# Patient Record
Sex: Female | Born: 1990 | Race: Black or African American | Hispanic: No | Marital: Single | Smoking: Never smoker
Health system: Southern US, Community
[De-identification: ages and names within clinical notes are randomized; demographics above are authoritative.]

## PROBLEM LIST (undated history)

## (undated) DIAGNOSIS — O009 Unspecified ectopic pregnancy without intrauterine pregnancy: Secondary | ICD-10-CM

## (undated) DIAGNOSIS — R87629 Unspecified abnormal cytological findings in specimens from vagina: Secondary | ICD-10-CM

## (undated) HISTORY — PX: ECTOPIC PREGNANCY SURGERY: SHX613

## (undated) HISTORY — DX: Unspecified ectopic pregnancy without intrauterine pregnancy: O00.90

## (undated) HISTORY — DX: Morbid (severe) obesity due to excess calories: E66.01

## (undated) HISTORY — DX: Unspecified abnormal cytological findings in specimens from vagina: R87.629

---
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
## 2008-05-13 ENCOUNTER — Emergency Department: Payer: Self-pay | Admitting: Unknown Physician Specialty

## 2008-07-27 ENCOUNTER — Emergency Department: Payer: Self-pay | Admitting: Emergency Medicine

## 2009-04-17 ENCOUNTER — Emergency Department: Payer: Self-pay | Admitting: Emergency Medicine

## 2009-11-18 ENCOUNTER — Emergency Department: Payer: Self-pay | Admitting: Emergency Medicine

## 2010-11-03 ENCOUNTER — Emergency Department: Payer: Self-pay | Admitting: Internal Medicine

## 2011-06-27 ENCOUNTER — Emergency Department: Payer: Self-pay | Admitting: Psychiatry

## 2012-02-18 ENCOUNTER — Emergency Department: Payer: Self-pay | Admitting: Emergency Medicine

## 2012-02-18 LAB — PREGNANCY, URINE: Pregnancy Test, Urine: NEGATIVE m[IU]/mL

## 2012-07-12 ENCOUNTER — Observation Stay: Payer: Self-pay | Admitting: Specialist

## 2012-07-12 HISTORY — PX: ANKLE SURGERY: SHX546

## 2012-07-12 LAB — BASIC METABOLIC PANEL
Calcium, Total: 8.8 mg/dL (ref 8.5–10.1)
Chloride: 108 mmol/L — ABNORMAL HIGH (ref 98–107)
Co2: 26 mmol/L (ref 21–32)
EGFR (African American): >60
Glucose: 106 mg/dL — ABNORMAL HIGH (ref 65–99)
Osmolality: 280 (ref 275–301)
Potassium: 4 mmol/L (ref 3.5–5.1)
Sodium: 140 mmol/L (ref 136–145)

## 2012-07-12 LAB — CBC
HGB: 13.7 g/dL (ref 12.0–16.0)
MCHC: 33.8 g/dL (ref 32.0–36.0)
RDW: 13.5 % (ref 11.5–14.5)
WBC: 8.6 10*3/uL (ref 3.6–11.0)

## 2012-08-27 ENCOUNTER — Emergency Department: Payer: Self-pay | Admitting: Emergency Medicine

## 2012-10-26 ENCOUNTER — Emergency Department: Payer: Self-pay | Admitting: Emergency Medicine

## 2012-12-30 ENCOUNTER — Emergency Department: Payer: Self-pay | Admitting: Emergency Medicine

## 2012-12-30 LAB — CBC
HCT: 40 % (ref 35.0–47.0)
HGB: 13.2 g/dL (ref 12.0–16.0)
MCH: 29 pg (ref 26.0–34.0)
MCV: 88 fL (ref 80–100)
Platelet: 230 10*3/uL (ref 150–440)
WBC: 8.6 10*3/uL (ref 3.6–11.0)

## 2012-12-30 LAB — COMPREHENSIVE METABOLIC PANEL
Albumin: 3.4 g/dL (ref 3.4–5.0)
Bilirubin,Total: 0.1 mg/dL — ABNORMAL LOW (ref 0.2–1.0)
Co2: 26 mmol/L (ref 21–32)
EGFR (African American): >60
EGFR (Non-African Amer.): >60
Osmolality: 272 (ref 275–301)
SGOT(AST): 22 U/L (ref 15–37)
Total Protein: 7.4 g/dL (ref 6.4–8.2)

## 2012-12-30 LAB — URINALYSIS, COMPLETE
Bilirubin,UR: NEGATIVE
Blood: NEGATIVE
Ketone: NEGATIVE
Nitrite: NEGATIVE
Protein: NEGATIVE
RBC,UR: 4 /HPF (ref 0–5)
Specific Gravity: 1.025 (ref 1.003–1.030)
Squamous Epithelial: 3

## 2013-01-05 ENCOUNTER — Emergency Department: Payer: Self-pay | Admitting: Emergency Medicine

## 2013-02-22 ENCOUNTER — Emergency Department: Payer: Self-pay | Admitting: Emergency Medicine

## 2013-02-22 LAB — COMPREHENSIVE METABOLIC PANEL
Albumin: 2.9 g/dL — ABNORMAL LOW (ref 3.4–5.0)
Alkaline Phosphatase: 64 U/L (ref 50–136)
BUN: 8 mg/dL (ref 7–18)
Bilirubin,Total: 0.2 mg/dL (ref 0.2–1.0)
Calcium, Total: 9 mg/dL (ref 8.5–10.1)
Chloride: 106 mmol/L (ref 98–107)
Co2: 24 mmol/L (ref 21–32)
EGFR (African American): >60
Osmolality: 274 (ref 275–301)
Potassium: 3.7 mmol/L (ref 3.5–5.1)
SGOT(AST): 130 U/L — ABNORMAL HIGH (ref 15–37)
SGPT (ALT): 302 U/L — ABNORMAL HIGH (ref 12–78)
Sodium: 138 mmol/L (ref 136–145)
Total Protein: 6.8 g/dL (ref 6.4–8.2)

## 2013-02-22 LAB — CBC
HGB: 12.2 g/dL (ref 12.0–16.0)
MCHC: 34.2 g/dL (ref 32.0–36.0)
MCV: 86 fL (ref 80–100)
RBC: 4.15 10*6/uL (ref 3.80–5.20)

## 2013-02-22 LAB — URINALYSIS, COMPLETE
Bilirubin,UR: NEGATIVE
Blood: NEGATIVE
Glucose,UR: NEGATIVE mg/dL (ref 0–75)
Ketone: NEGATIVE
RBC,UR: 1 /HPF (ref 0–5)
Specific Gravity: 1.021 (ref 1.003–1.030)
Squamous Epithelial: 17

## 2013-03-14 ENCOUNTER — Observation Stay: Payer: Self-pay | Admitting: Obstetrics and Gynecology

## 2013-03-14 LAB — URINALYSIS, COMPLETE
Blood: NEGATIVE
Glucose,UR: 50 mg/dL (ref 0–75)
Ketone: NEGATIVE
RBC,UR: 1 /HPF (ref 0–5)
Squamous Epithelial: 2
WBC UR: 2 /HPF (ref 0–5)

## 2013-05-04 ENCOUNTER — Observation Stay: Payer: Self-pay | Admitting: Obstetrics and Gynecology

## 2013-05-04 LAB — BASIC METABOLIC PANEL
Anion Gap: 7 (ref 7–16)
BUN: 8 mg/dL (ref 7–18)
Calcium, Total: 9 mg/dL (ref 8.5–10.1)
Creatinine: 0.68 mg/dL (ref 0.60–1.30)
EGFR (Non-African Amer.): >60
Glucose: 77 mg/dL (ref 65–99)
Osmolality: 275 (ref 275–301)
Potassium: 4 mmol/L (ref 3.5–5.1)
Sodium: 139 mmol/L (ref 136–145)

## 2013-05-04 LAB — URINALYSIS, COMPLETE
Bacteria: NONE SEEN
Bilirubin,UR: NEGATIVE
Glucose,UR: NEGATIVE mg/dL (ref 0–75)
Leukocyte Esterase: NEGATIVE
Ph: 6 (ref 4.5–8.0)
Protein: NEGATIVE
Specific Gravity: 1.016 (ref 1.003–1.030)
Squamous Epithelial: <1
WBC UR: 2 /HPF (ref 0–5)

## 2013-05-30 ENCOUNTER — Observation Stay: Payer: Self-pay | Admitting: Obstetrics and Gynecology

## 2013-05-30 LAB — CBC
MCH: 31 pg (ref 26.0–34.0)
MCHC: 35.1 g/dL (ref 32.0–36.0)
RBC: 3.9 10*6/uL (ref 3.80–5.20)

## 2013-05-30 LAB — BASIC METABOLIC PANEL
Anion Gap: 6 — ABNORMAL LOW (ref 7–16)
Chloride: 108 mmol/L — ABNORMAL HIGH (ref 98–107)
Co2: 24 mmol/L (ref 21–32)
Creatinine: 0.55 mg/dL — ABNORMAL LOW (ref 0.60–1.30)
EGFR (African American): >60
EGFR (Non-African Amer.): >60
Glucose: 108 mg/dL — ABNORMAL HIGH (ref 65–99)
Osmolality: 274 (ref 275–301)

## 2013-06-17 ENCOUNTER — Observation Stay: Payer: Self-pay | Admitting: Obstetrics and Gynecology

## 2013-06-17 LAB — URINALYSIS, COMPLETE
Bilirubin,UR: NEGATIVE
Blood: NEGATIVE
Glucose,UR: 50 mg/dL (ref 0–75)
Ketone: NEGATIVE
Nitrite: NEGATIVE
Ph: 7 (ref 4.5–8.0)
Protein: NEGATIVE
Specific Gravity: 1.011 (ref 1.003–1.030)
Squamous Epithelial: 4
WBC UR: 3 /HPF (ref 0–5)

## 2013-06-19 LAB — URINE CULTURE

## 2013-07-05 ENCOUNTER — Observation Stay: Payer: Self-pay | Admitting: Obstetrics and Gynecology

## 2013-07-17 ENCOUNTER — Observation Stay: Payer: Self-pay

## 2013-07-25 ENCOUNTER — Observation Stay: Payer: Self-pay | Admitting: Obstetrics & Gynecology

## 2013-07-27 ENCOUNTER — Inpatient Hospital Stay: Payer: Self-pay

## 2013-07-27 LAB — CBC WITH DIFFERENTIAL/PLATELET
Basophil #: 0 10*3/uL (ref 0.0–0.1)
Basophil %: 0.3 %
Eosinophil #: 0.1 10*3/uL (ref 0.0–0.7)
Eosinophil %: 0.9 %
HCT: 37.6 % (ref 35.0–47.0)
Lymphocyte #: 1.5 10*3/uL (ref 1.0–3.6)
Lymphocyte %: 17.3 %
MCHC: 34.4 g/dL (ref 32.0–36.0)
MCV: 87 fL (ref 80–100)
Monocyte #: 0.7 x10 3/mm (ref 0.2–0.9)
Monocyte %: 8.2 %
Neutrophil #: 6.3 10*3/uL (ref 1.4–6.5)
Platelet: 180 10*3/uL (ref 150–440)
RBC: 4.31 10*6/uL (ref 3.80–5.20)
RDW: 14 % (ref 11.5–14.5)

## 2013-07-28 LAB — HEMATOCRIT: HCT: 32.2 % — ABNORMAL LOW (ref 35.0–47.0)

## 2013-08-03 ENCOUNTER — Emergency Department: Payer: Self-pay | Admitting: Emergency Medicine

## 2013-08-03 LAB — COMPREHENSIVE METABOLIC PANEL
Albumin: 2.4 g/dL — ABNORMAL LOW (ref 3.4–5.0)
Alkaline Phosphatase: 181 U/L — ABNORMAL HIGH (ref 50–136)
Anion Gap: 7 (ref 7–16)
Bilirubin,Total: 0.2 mg/dL (ref 0.2–1.0)
Calcium, Total: 8.4 mg/dL — ABNORMAL LOW (ref 8.5–10.1)
Creatinine: 0.9 mg/dL (ref 0.60–1.30)
EGFR (African American): >60
EGFR (Non-African Amer.): >60
Osmolality: 282 (ref 275–301)
Potassium: 3.7 mmol/L (ref 3.5–5.1)
SGOT(AST): 38 U/L — ABNORMAL HIGH (ref 15–37)
Total Protein: 6.1 g/dL — ABNORMAL LOW (ref 6.4–8.2)

## 2013-08-03 LAB — CBC WITH DIFFERENTIAL/PLATELET
Basophil #: 0 10*3/uL (ref 0.0–0.1)
Basophil %: 0.3 %
Eosinophil #: 0.2 10*3/uL (ref 0.0–0.7)
Eosinophil %: 2 %
HGB: 11.3 g/dL — ABNORMAL LOW (ref 12.0–16.0)
Lymphocyte %: 22.2 %
MCH: 30.3 pg (ref 26.0–34.0)
MCHC: 34.9 g/dL (ref 32.0–36.0)
MCV: 87 fL (ref 80–100)
Monocyte %: 7.6 %
Neutrophil #: 5.2 10*3/uL (ref 1.4–6.5)
RBC: 3.72 10*6/uL — ABNORMAL LOW (ref 3.80–5.20)
RDW: 14 % (ref 11.5–14.5)
WBC: 7.6 10*3/uL (ref 3.6–11.0)

## 2013-08-03 LAB — URINALYSIS, COMPLETE
Bacteria: NONE SEEN
Bilirubin,UR: NEGATIVE
Ketone: NEGATIVE
Ph: 6 (ref 4.5–8.0)
Protein: NEGATIVE
Specific Gravity: 1.009 (ref 1.003–1.030)
Squamous Epithelial: 4

## 2013-08-03 LAB — FIBRINOGEN: Fibrinogen: 415 mg/dL (ref 210–470)

## 2013-09-02 ENCOUNTER — Encounter: Payer: Self-pay | Admitting: Podiatry

## 2013-09-02 ENCOUNTER — Ambulatory Visit (INDEPENDENT_AMBULATORY_CARE_PROVIDER_SITE_OTHER): Payer: Medicaid Other

## 2013-09-02 ENCOUNTER — Ambulatory Visit (INDEPENDENT_AMBULATORY_CARE_PROVIDER_SITE_OTHER): Payer: Medicaid Other | Admitting: Podiatry

## 2013-09-02 VITALS — BP 125/75 | HR 68 | Resp 16 | Ht 60.0 in | Wt 215.0 lb

## 2013-09-02 DIAGNOSIS — M898X9 Other specified disorders of bone, unspecified site: Secondary | ICD-10-CM

## 2013-09-02 DIAGNOSIS — S93409A Sprain of unspecified ligament of unspecified ankle, initial encounter: Secondary | ICD-10-CM

## 2013-09-02 DIAGNOSIS — M25571 Pain in right ankle and joints of right foot: Secondary | ICD-10-CM

## 2013-09-02 DIAGNOSIS — S93609A Unspecified sprain of unspecified foot, initial encounter: Secondary | ICD-10-CM

## 2013-09-02 DIAGNOSIS — M779 Enthesopathy, unspecified: Secondary | ICD-10-CM

## 2013-09-02 MED ORDER — TRIAMCINOLONE ACETONIDE 10 MG/ML IJ SUSP
5.0000 mg | Freq: Once | INTRAMUSCULAR | Status: AC
  Administered 2013-09-02: 5 mg via INTRA_ARTICULAR

## 2013-09-02 NOTE — Patient Instructions (Signed)
Appointment at [facility] or [facility]

## 2013-09-02 NOTE — Progress Notes (Signed)
Subjective:     Patient ID: Miranda Fields, female   DOB: Mar 03, 1991, 22 y.o.   MRN: [id number]  Ankle Pain    patient points to the right ankle stating I had surgery last September and has continued to bother me. Patient states it swells at the end of the day and she has no idea what was done and does not know the doctor that did  Review of Systems  All other systems reviewed and are negative.       Objective:   Physical Exam  Nursing note and vitals reviewed. Constitutional: She is oriented to person, place, and time.  Cardiovascular: Intact distal pulses.   Musculoskeletal: Normal range of motion.  Neurological: She is oriented to person, place, and time.  Skin: Skin is warm.   patient's right lateral ankle is tender in the sinus tarsi and I didn't note normal range of motion with some popping but no crepitus mild edema noted diffuse in its nature    Assessment:     Difficult to give description because of poor historian. Sinus tarsitis    Plan:     H&P and x-rays reviewed with patient. Today I injected the sinus tarsi capsule 3 mg Kenalog 5 mg Xylocaine Marcaine mixture and advised to go to [facility] or to [place] for evaluation
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
## 2013-09-05 LAB — HM PAP SMEAR: HM Pap smear: NEGATIVE

## 2013-11-10 ENCOUNTER — Emergency Department: Payer: Self-pay | Admitting: Emergency Medicine

## 2013-11-10 LAB — COMPREHENSIVE METABOLIC PANEL
ALBUMIN: 3.7 g/dL (ref 3.4–5.0)
ALK PHOS: 132 U/L — AB
Anion Gap: 5 — ABNORMAL LOW (ref 7–16)
BUN: 14 mg/dL (ref 7–18)
Bilirubin,Total: 0.2 mg/dL (ref 0.2–1.0)
CALCIUM: 9 mg/dL (ref 8.5–10.1)
CHLORIDE: 106 mmol/L (ref 98–107)
CREATININE: 0.86 mg/dL (ref 0.60–1.30)
Co2: 29 mmol/L (ref 21–32)
EGFR (African American): >60
EGFR (Non-African Amer.): >60
GLUCOSE: 88 mg/dL (ref 65–99)
OSMOLALITY: 279 (ref 275–301)
POTASSIUM: 3.6 mmol/L (ref 3.5–5.1)
SGOT(AST): 13 U/L — ABNORMAL LOW (ref 15–37)
SGPT (ALT): 26 U/L (ref 12–78)
Sodium: 140 mmol/L (ref 136–145)
TOTAL PROTEIN: 7.4 g/dL (ref 6.4–8.2)

## 2013-11-10 LAB — URINALYSIS, COMPLETE
BILIRUBIN, UR: NEGATIVE
Bacteria: NONE SEEN
Blood: NEGATIVE
Glucose,UR: NEGATIVE mg/dL (ref 0–75)
Ketone: NEGATIVE
NITRITE: NEGATIVE
PH: 6 (ref 4.5–8.0)
Protein: NEGATIVE
RBC,UR: 1 /HPF (ref 0–5)
Specific Gravity: 1.018 (ref 1.003–1.030)
Squamous Epithelial: 2

## 2013-11-10 LAB — CBC WITH DIFFERENTIAL/PLATELET
Basophil #: 0 10*3/uL (ref 0.0–0.1)
Basophil %: 0.4 %
Eosinophil #: 0.1 10*3/uL (ref 0.0–0.7)
Eosinophil %: 1 %
HCT: 40.1 % (ref 35.0–47.0)
HGB: 13.3 g/dL (ref 12.0–16.0)
Lymphocyte #: 1.8 10*3/uL (ref 1.0–3.6)
Lymphocyte %: 25 %
MCH: 28.2 pg (ref 26.0–34.0)
MCHC: 33.1 g/dL (ref 32.0–36.0)
MCV: 85 fL (ref 80–100)
MONOS PCT: 5.8 %
Monocyte #: 0.4 x10 3/mm (ref 0.2–0.9)
Neutrophil #: 5 10*3/uL (ref 1.4–6.5)
Neutrophil %: 67.8 %
Platelet: 272 10*3/uL (ref 150–440)
RBC: 4.69 10*6/uL (ref 3.80–5.20)
RDW: 14.5 % (ref 11.5–14.5)
WBC: 7.3 10*3/uL (ref 3.6–11.0)

## 2013-11-10 LAB — TROPONIN I

## 2013-11-10 LAB — LIPASE, BLOOD: Lipase: 124 U/L (ref 73–393)

## 2014-05-11 ENCOUNTER — Emergency Department: Payer: Self-pay | Admitting: Emergency Medicine

## 2014-05-11 LAB — COMPREHENSIVE METABOLIC PANEL
ALT: 29 U/L (ref 12–78)
Albumin: 3.5 g/dL (ref 3.4–5.0)
Alkaline Phosphatase: 114 U/L
Anion Gap: 8 (ref 7–16)
BUN: 11 mg/dL (ref 7–18)
Bilirubin,Total: 0.6 mg/dL (ref 0.2–1.0)
CREATININE: 0.86 mg/dL (ref 0.60–1.30)
Calcium, Total: 8.4 mg/dL — ABNORMAL LOW (ref 8.5–10.1)
Chloride: 105 mmol/L (ref 98–107)
Co2: 25 mmol/L (ref 21–32)
EGFR (Non-African Amer.): >60
Glucose: 90 mg/dL (ref 65–99)
Osmolality: 275 (ref 275–301)
Potassium: 3.5 mmol/L (ref 3.5–5.1)
SGOT(AST): 28 U/L (ref 15–37)
Sodium: 138 mmol/L (ref 136–145)
Total Protein: 7.5 g/dL (ref 6.4–8.2)

## 2014-05-11 LAB — CBC
HCT: 41.9 % (ref 35.0–47.0)
HGB: 14.1 g/dL (ref 12.0–16.0)
MCH: 29.6 pg (ref 26.0–34.0)
MCHC: 33.8 g/dL (ref 32.0–36.0)
MCV: 88 fL (ref 80–100)
PLATELETS: 203 10*3/uL (ref 150–440)
RBC: 4.77 10*6/uL (ref 3.80–5.20)
RDW: 13.5 % (ref 11.5–14.5)
WBC: 6.3 10*3/uL (ref 3.6–11.0)

## 2014-05-11 LAB — URINALYSIS, COMPLETE
Bacteria: NONE SEEN
Bilirubin,UR: NEGATIVE
GLUCOSE, UR: NEGATIVE mg/dL (ref 0–75)
Ketone: NEGATIVE
Nitrite: NEGATIVE
PH: 6 (ref 4.5–8.0)
PROTEIN: NEGATIVE
RBC,UR: 2 /HPF (ref 0–5)
Specific Gravity: 1.018 (ref 1.003–1.030)
Squamous Epithelial: 4
WBC UR: 2 /HPF (ref 0–5)

## 2014-05-11 LAB — LIPASE, BLOOD: LIPASE: 102 U/L (ref 73–393)

## 2014-09-25 IMAGING — US ABDOMEN ULTRASOUND LIMITED
1 series · 14 of 25 positions shown · non-contrast
Comparison: none

REASON FOR EXAM: ABD PAIN AND ABN LFTS
COMMENTS:   Body Site: GB and Fossa, CBD, Head of Pancreas; Right Upper Quad

PROCEDURE:     US  - US ABDOMEN LIMITED SURVEY  - February 22, 2013  [DATE]
RESULT:     Comparison: None
TECHNIQUE: Multiple gray-scale and color-flow Doppler images of the right
upper quadrant are presented for review.

[Series 1: abdomen ultrasound limited · 0.25mm/px · 14 of 47 slices shown]
[im 1/47]
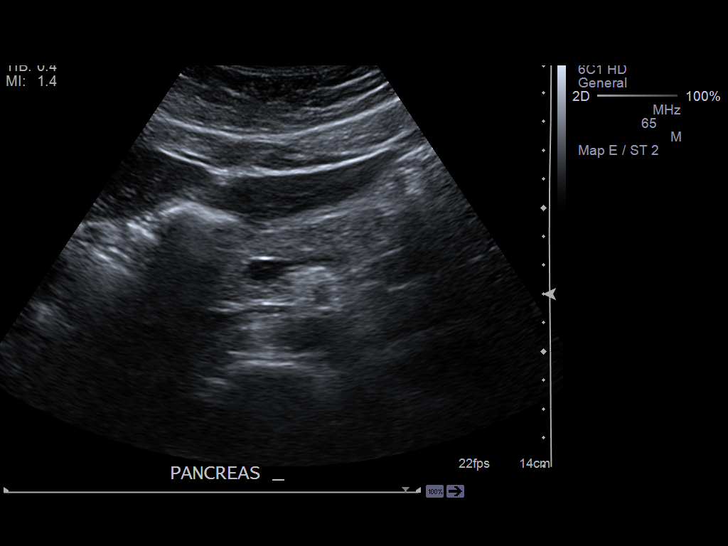
[im 4/47]
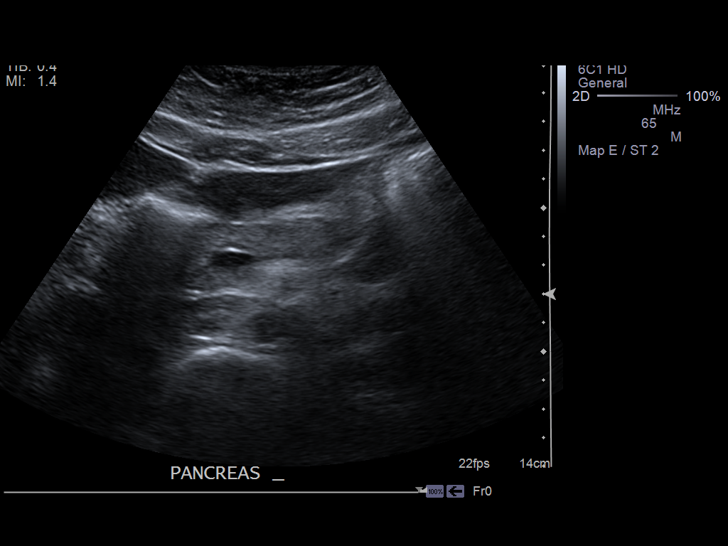
[im 8/47]
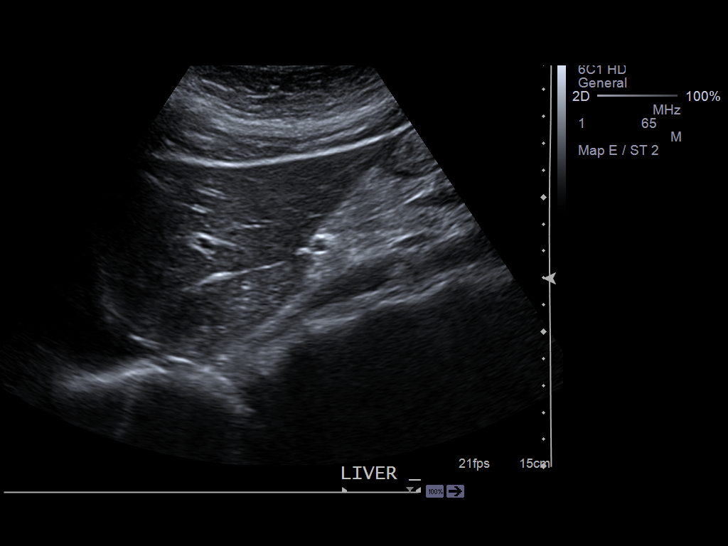
[im 12/47]
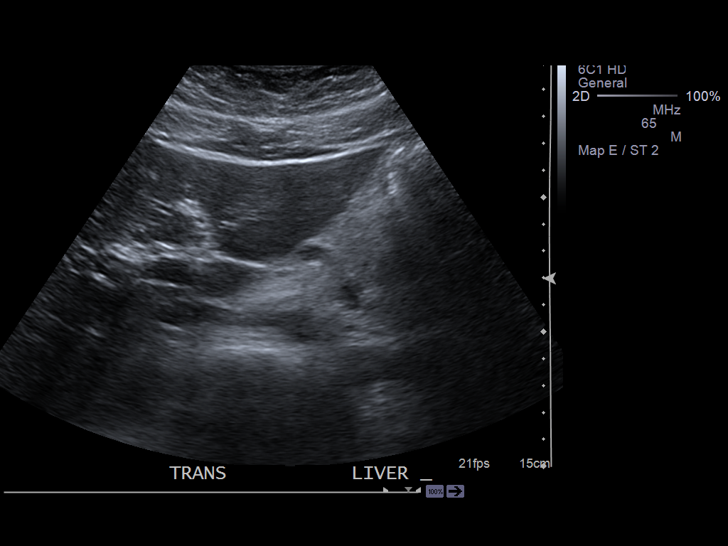
[im 16/47]
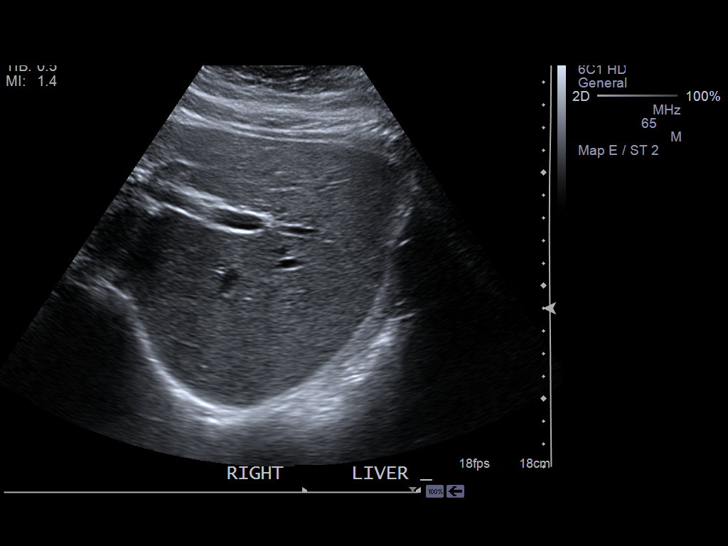
[im 18/47]
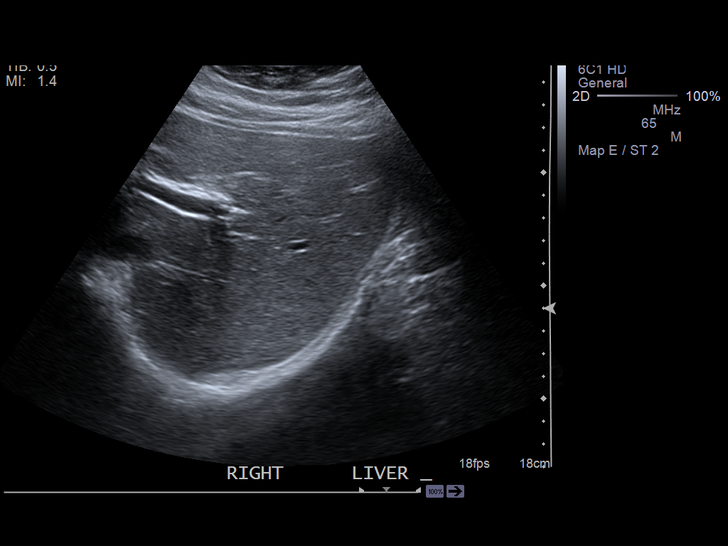
[im 22/47]
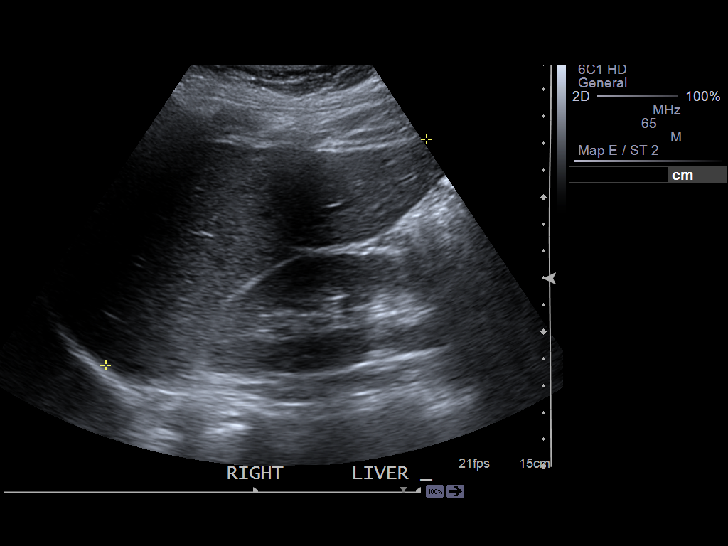
[im 25/47]
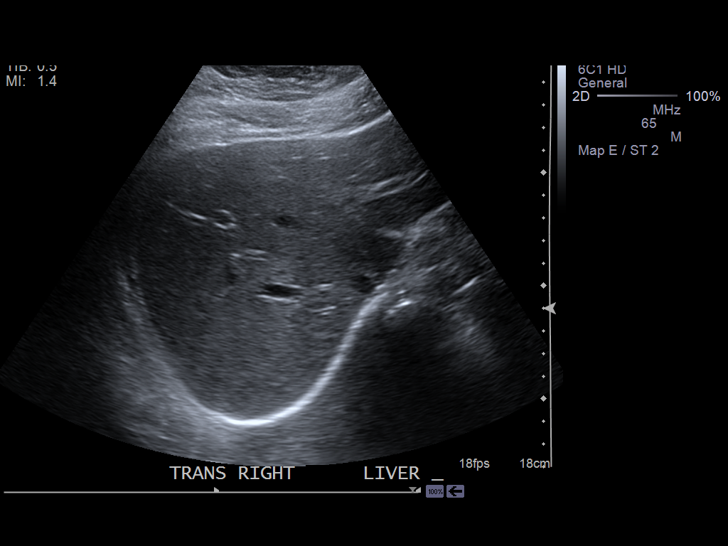
[im 29/47]
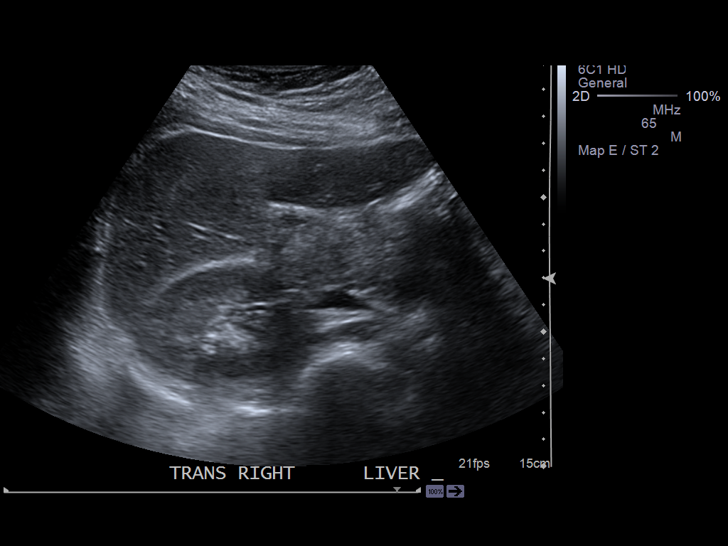
[im 31/47]
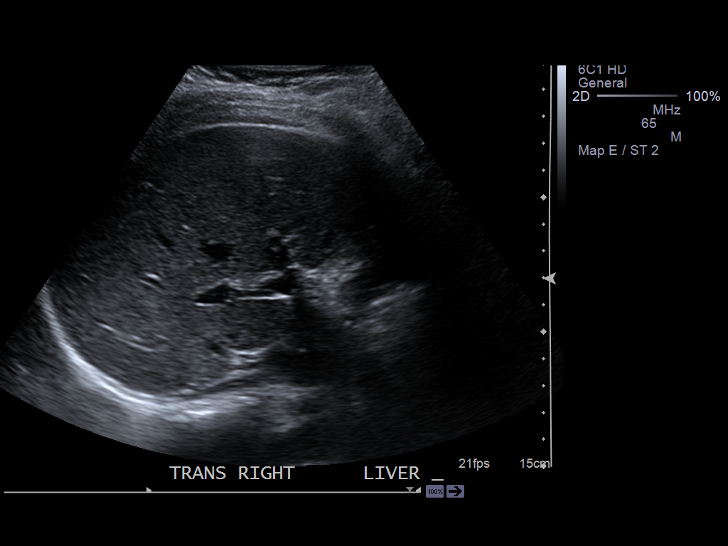
[im 35/47]
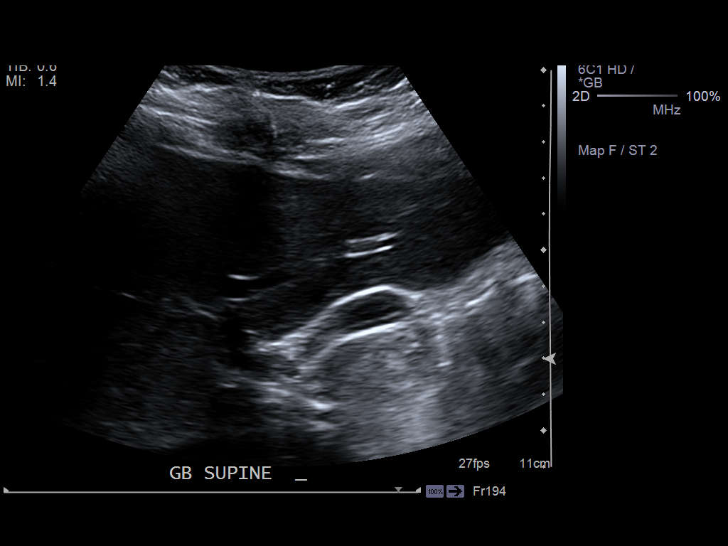
[im 39/47]
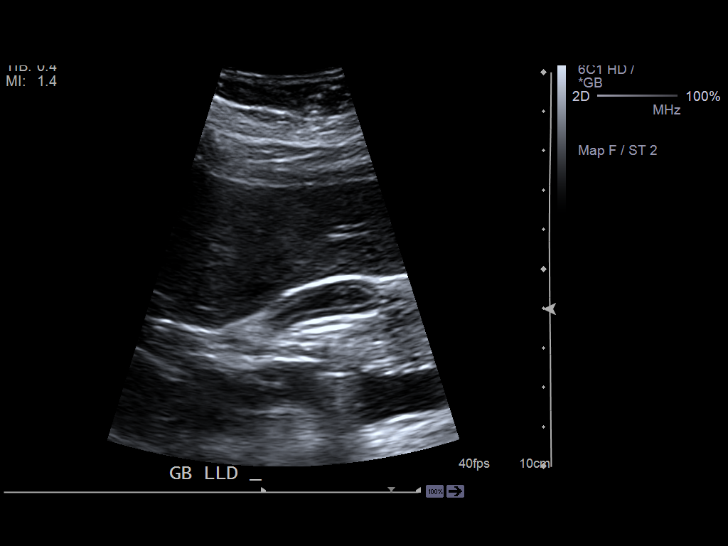
[im 43/47]
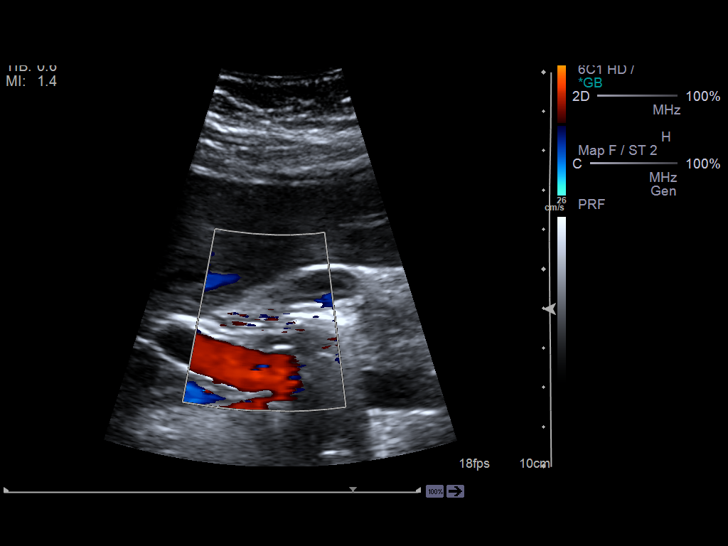
[im 47/47]
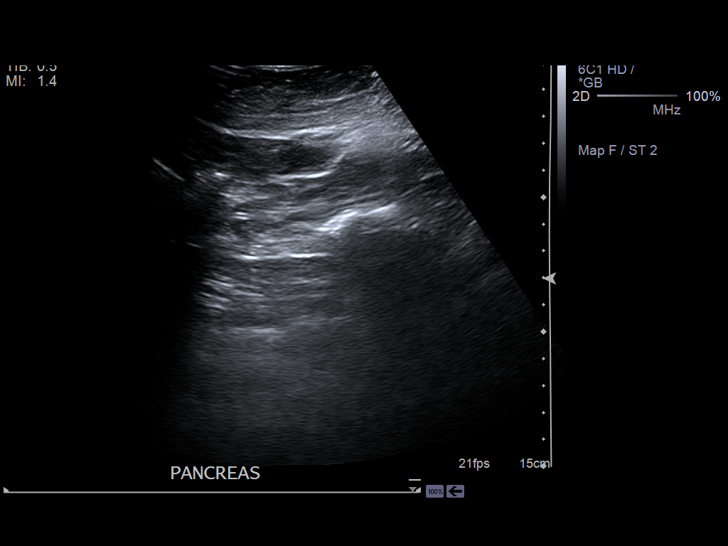

[14 of 25 positions shown; findings below may reference images not displayed]

FINDINGS: Visualized portions of the liver demonstrate normal echogenicity and normal
contours. The liver is without evidence of a focal hepatic lesion.

There is no cholelithiasis or biliary sludge. There is no intra- or
extrahepatic biliary ductal dilatation. The common duct measures 2.9 mm in
maximal diameter. The gallbladder is contracted resulting in artifactual
mild thickening of the gallbladder wall. There is no pericholecystic fluid.

The visualized portion of the pancreas is normal in echogenicity.
IMPRESSION: No cholelithiasis or sonographic evidence of acute cholecystitis.

[REDACTED]

## 2014-10-19 ENCOUNTER — Emergency Department: Payer: Self-pay | Admitting: Emergency Medicine
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
## 2015-02-16 NOTE — Op Note (Signed)
PATIENT NAME:  Miranda Fields, Miranda Fields MR#:  [id number] DATE OF BIRTH:  02-09-1991  DATE OF PROCEDURE:  07/12/2012  PREOPERATIVE DIAGNOSIS: Open posteromedial right ankle dislocation.  POSTOPERATIVE DIAGNOSIS: Open posteromedial right ankle dislocation.   PROCEDURE PERFORMED:  Irrigation and debridement anterior right ankle wound.   SURGEON: Myra Rudehristopher Demarus Latterell, MD   ANESTHESIA: General.   COMPLICATIONS: None.   DRAINS: One Penrose.   ESTIMATED BLOOD LOSS: 25 mL.   TOURNIQUET TIME: None.   DESCRIPTION OF PROCEDURE: Two grams of Ancef had previously been given intravenously in the Emergency Room.  Also, the Emergency Room physician, Dr. Shaune PollackLord, had previously reduced into an essentially anatomic position the posteromedial dislocation of the ankle. Postoperative films demonstrated no evidence of fracture. The patient was brought to the Operating Room and general anesthesia was induced. The right lower extremity was thoroughly prepped with ChloraPrep and draped in standard sterile fashion. The anterior laceration wound which extended down to the ankle joint was thoroughly irrigated multiple times with 3000 mL of saline, and this included all soft tissues and the joint itself. The wound was then loosely closed with a 3-0 nylon sutures and a Penrose drain exiting through a separate site. A soft bulky dressing was applied along with a sugar tong type fiberglass splint. The final reduction in the splint was checked in AP and lateral views and was seen to be satisfactory. The patient was returned to the recovery room in satisfactory condition having tolerated the procedure quite well.  ____________________________ Clare Gandyhristopher E. Chanan Detwiler, MD ces:cbb Fields: 07/12/2012 15:42:56 ET T: 07/12/2012 16:17:10 ET JOB#: [id number] Clare GandyHRISTOPHER E Kymani Shimabukuro MD ELECTRONICALLY SIGNED 07/24/2012 7:55

## 2015-03-09 NOTE — H&P (Signed)
L&D Evaluation:  History Expanded:  HPI 24 yo G1 at 7421w3d, EDD of 07/28/13 per 12 week US, presents with c/o vaginal bleeding yesterday and passing small blood clot this evening. Also reports LBP and cramping pressure. Denies LOF or decreased FM. [facility], early entry to care, Obesity protocol.   Blood Type (Maternal) O positive   Group B Strep Results Maternal (Result >5wks must be treated as unknown) positive   Maternal HIV Negative   Maternal Syphilis Ab Nonreactive   Maternal Varicella Immune   Rubella Results (Maternal) immune   Patient's Medical History Obesity   Patient's Surgical History reduction of foot fracture   Medications Pre Natal Vitamins  ASA   Allergies NKDA   Social History none   Family History Non-Contributory   ROS:  ROS see HPI   Exam:  Vital Signs stable   General no apparent distress   Mental Status clear   Abdomen gravid, non-tender   Edema no edema   Pelvic no external lesions, no bleeding noted on speculum exam, 2/75/-2   Mebranes Intact   FHT normal rate with no decels, Category 1 tracing   Ucx absent   Impression:  Impression IUP at 38wks, vaginal spotting, no evidence of frank bleeding   Plan:  Plan discharge   Comments Pt reassured about reactive findings   Follow Up Appointment already scheduled. 9/25   Electronic Signatures: Vella KohlerBrothers, Almus Woodham K (CNM)  (Signed 18-Sep-14 22:19)  Authored: L&D Evaluation   Last Updated: 18-Sep-14 22:19 by Vella KohlerBrothers, Erroll Wilbourne K (CNM)
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
## 2015-03-09 NOTE — H&P (Signed)
L&D Evaluation:  History Expanded:  HPI 24 yo at who has had pain since church this am that has her in tears, she is 7949w5d pregnant and she is having low abd pian across the top if her uterus tha is cusing tears,   Gravida 1   Term 0   PreTerm 0   Abortion 0   Living 0   Maternal HIV Negative   Maternal Syphilis Ab Nonreactive   Maternal Varicella Immune   Rubella Results (Maternal) immune   Maternal T-Dap Unknown   [facility]   Presents with abdominal pain   Patient's Medical History No Chronic Illness   Patient's Surgical History none   Medications Pre Natal Vitamins   Allergies NKDA   Social History none   Family History Non-Contributory   Current Prenatal Course Notable For obesity   ROS:  General in ditress form low abdominal pain   HEENT normal   CNS normal   GI normal   GU normal   Resp normal   CV normal   Renal normal   MS normal   Exam:  Vital Signs stable   Urine Protein pending   General no apparent distress   Mental Status clear   Chest clear   Heart normal sinus rhythm   Estimated Fetal Weight Small for gestational age   Back no CVAT   Edema 1+   Pelvic no external lesions   Mebranes Intact   FHT normal rate with no decels   Fetal Heart Rate 150   Ucx absent   Skin dry   Impression:  Impression low abdominal pain poss UTI   Plan:  Plan UA, monitor contractions and for cervical change   Comments Check urine, do ffN and check cervix, pt wants to et and family wants to get her K and W and bring n. they are discouraged from doing this as the dispoition is unknown. If she is hungry and wants to eat above all else and stops crying when she talks about eating, I am more suspicious of a chronic musculoskeletal problem. rather than an emergent problem.   Follow Up Appointment need to schedule. call on Monday   Electronic Signatures: Adria DevonKlett, Ala Capri (MD)  (Signed 06-Jul-14 13:51)  Authored: L&D  Evaluation   Last Updated: 06-Jul-14 13:51 by Adria DevonKlett, Demontae Antunes (MD)

## 2015-03-09 NOTE — H&P (Signed)
L&D Evaluation:  History:  HPI Pt is a 24 yo G1P0 at 39.[redacted] weeks GA who presents to L&D with c/o her water breaking at 4am this morning. She reports +FM, denies vb or ctx. She is GBS+, O+, RI, VI. Her prenatal care has been complicated by obesity, and an elevated BP at her first prenatal visit.   Presents with leaking fluid   Patient's Medical History ? chronic HTN   Patient's Surgical History other  ankle repair   Medications Pre Natal Vitamins   Allergies NKDA   Social History none   Family History other  HTN   ROS:  ROS All systems were reviewed.  HEENT, CNS, GI, GU, Respiratory, CV, Renal and Musculoskeletal systems were found to be normal.   Exam:  Vital Signs stable   General no apparent distress   Mental Status clear   Chest clear   Heart normal sinus rhythm   Abdomen gravid, non-tender   Edema 1+   Mebranes Ruptured   Description clear   FHT normal rate with no decels   Fetal Heart Rate 130   Ucx irregular, not having any painful ctx   Skin dry, no lesions   Lymph no lymphadenopathy   Impression:  Impression reactive NST, IUP at 39.6, PROM   Plan:  Plan EFM/NST, antibiotics for GBBS prophylaxis, fluids, ambulate, start pitocin if no ctx.   Follow Up Appointment need to schedule   Electronic Signatures: Jannet MantisSubudhi, Quinetta Shilling (CNM)  (Signed 28-Sep-14 10:47)  Authored: L&D Evaluation   Last Updated: 28-Sep-14 10:47 by Jannet MantisSubudhi, Lamine Laton (CNM)

## 2015-04-09 ENCOUNTER — Encounter: Payer: Self-pay | Admitting: Emergency Medicine

## 2015-04-09 ENCOUNTER — Emergency Department
Admission: EM | Admit: 2015-04-09 | Discharge: 2015-04-09 | Disposition: A | Discharge status: Home / Self Care | Payer: Self-pay | Attending: Emergency Medicine | Admitting: Emergency Medicine

## 2015-04-09 DIAGNOSIS — H109 Unspecified conjunctivitis: Secondary | ICD-10-CM | POA: Insufficient documentation

## 2015-04-09 DIAGNOSIS — J029 Acute pharyngitis, unspecified: Secondary | ICD-10-CM | POA: Insufficient documentation

## 2015-04-09 DIAGNOSIS — Z79899 Other long term (current) drug therapy: Secondary | ICD-10-CM | POA: Insufficient documentation

## 2015-04-09 MED ORDER — SULFACETAMIDE SODIUM 10 % OP SOLN: 2.0000 [drp] | Freq: Four times a day (QID) | OPHTHALMIC | Status: DC

## 2015-04-09 MED ORDER — AZITHROMYCIN 250 MG PO TABS: ORAL_TABLET | ORAL | Status: DC

## 2015-04-09 NOTE — Discharge Instructions (Signed)

## 2015-04-09 NOTE — ED Notes (Signed)
Pt alert and oriented X4, active, cooperative, pt in NAD. RR even and unlabored, color WNL.  Pt informed to return if any life threatening symptoms occur.   

## 2015-04-09 NOTE — ED Provider Notes (Signed)
[facility] Emergency Department Provider Note  ____________________________________________  Time seen: Approximately 10:09 AM  I have reviewed the triage vital signs and the nursing notes.   HISTORY  Chief Complaint Eye Drainage    HPI Miranda Fields is a 24 y.o. female who presents for evaluation of right eye redness and drainage for 2 days. Denies any trauma. Minimal pain reports that the right eye is stuck together in the mornings upon awakening.   History reviewed. No pertinent past medical history.  There are no active problems to display for this patient.   Past Surgical History  Procedure Laterality Date  . Ankle surgery Right     Current Outpatient Rx  Name  Route  Sig  Dispense  Refill  . azithromycin (ZITHROMAX Z-PAK) 250 MG tablet      Take 2 tablets (500 mg) on  Day 1,  followed by 1 tablet (250 mg) once daily on Days 2 through 5.   6 each   0   . Prenatal Multivit-Min-Fe-FA (PRENATAL VITAMINS PO)   Oral   Take by mouth.         . sulfacetamide (BLEPH-10) 10 % ophthalmic solution   Both Eyes   Place 2 drops into both eyes 4 (four) times daily.   5 mL   0     Allergies Review of patient's allergies indicates no known allergies.  No family history on file.  Social History History  Substance Use Topics  . Smoking status: Never Smoker   . Smokeless tobacco: Never Used  . Alcohol Use: Yes     Comment: social     Review of Systems Constitutional: No fever/chills Eyes: Right eye drainage and redness. ENT: No sore throat. Cardiovascular: Denies chest pain. Respiratory: Denies shortness of breath. Gastrointestinal: No abdominal pain.  No nausea, no vomiting.  No diarrhea.  No constipation. Genitourinary: Negative for dysuria. Musculoskeletal: Negative for back pain. Skin: Negative for rash. Neurological: Negative for headaches, focal weakness or numbness.  10-point ROS otherwise  negative.  ____________________________________________   PHYSICAL EXAM:  VITAL SIGNS: ED Triage Vitals  Enc Vitals Group     BP --      Pulse --      Resp --      Temp --      Temp src --      SpO2 --      Weight --      Height --      Head Cir --      Peak Flow --      Pain Score 04/09/15 1001 7     Pain Loc --      Pain Edu? --      Excl. in GC? --     Constitutional: Alert and oriented. Well appearing and in no acute distress. Eyes: Conjunctivae are erythematous with drainage noted to the right eye.Marland Kitchen PERRL. EOMI. Sub-conjunctival hemorrhage noted medially Head: Atraumatic. Nose: No congestion/rhinnorhea. Mouth/Throat: Mucous membranes are moist.  Oropharynx erythematous right greater than left.. Neck: No stridor.   Cardiovascular: Normal rate, regular rhythm. Grossly normal heart sounds.  Good peripheral circulation. Respiratory: Normal respiratory effort.  No retractions. Lungs CTAB. Gastrointestinal: Soft and nontender. No distention. No abdominal bruits. No CVA tenderness. Musculoskeletal: No lower extremity tenderness nor edema.  No joint effusions. Neurologic:  Normal speech and language. No gross focal neurologic deficits are appreciated. Speech is normal. No gait instability. Skin:  Skin is warm, dry and intact. No rash noted.  Psychiatric: Mood and affect are normal. Speech and behavior are normal.  ____________________________________________   LABS (all labs ordered are listed, but only abnormal results are displayed)  Labs Reviewed - No data to display ____________________________________________  EKG  Deferred ____________________________________________  RADIOLOGY  Deferred ____________________________________________   PROCEDURES  Procedure(s) performed: None  Critical Care performed: No  ____________________________________________   INITIAL IMPRESSION / ASSESSMENT AND PLAN / ED COURSE  Pertinent labs & imaging results that were  available during my care of the patient were reviewed by me and considered in my medical decision making (see chart for details).  Conjunctivitis noted to the right eye. Q pharyngitis. Rx with sodium Sulamyd eyedrops and Z-Pak.. Encouraged good handwashing techniques and to follow-up if symptoms worsen. Patient voices no other emergency medical complaints at this visit. ____________________________________________   FINAL CLINICAL IMPRESSION(S) / ED DIAGNOSES  Final diagnoses:  Conjunctivitis of right eye  Acute pharyngitis, unspecified pharyngitis type      Evangeline Dakin, PA-C 04/09/15 [address] Paige Monarrez, PA-C 04/09/15 1023  Sharyn Creamer, MD 04/11/15 1512

## 2015-04-09 NOTE — ED Notes (Addendum)
Pt states that her throat started getting sore about week ago, she got a throat spray but it did not help  . 2 days ago her eye started watering . This morning she work up with swelling in her right eye and pain. The eye appears to be red and swollen. Breathing is within normal limits.

## 2015-04-09 NOTE — ED Notes (Signed)
Pt to ed with c/o right eye redness and drainage x 2 days.

## 2015-09-24 ENCOUNTER — Encounter: Payer: Self-pay | Admitting: Emergency Medicine

## 2015-09-24 ENCOUNTER — Emergency Department: Payer: Self-pay

## 2015-09-24 ENCOUNTER — Emergency Department
Admission: EM | Admit: 2015-09-24 | Discharge: 2015-09-25 | Disposition: A | Discharge status: Home / Self Care | Payer: Self-pay | Attending: Emergency Medicine | Admitting: Emergency Medicine

## 2015-09-24 DIAGNOSIS — Y9301 Activity, walking, marching and hiking: Secondary | ICD-10-CM | POA: Insufficient documentation

## 2015-09-24 DIAGNOSIS — S93402A Sprain of unspecified ligament of left ankle, initial encounter: Secondary | ICD-10-CM | POA: Insufficient documentation

## 2015-09-24 DIAGNOSIS — W108XXA Fall (on) (from) other stairs and steps, initial encounter: Secondary | ICD-10-CM | POA: Insufficient documentation

## 2015-09-24 DIAGNOSIS — Z79899 Other long term (current) drug therapy: Secondary | ICD-10-CM | POA: Insufficient documentation

## 2015-09-24 DIAGNOSIS — Y998 Other external cause status: Secondary | ICD-10-CM | POA: Insufficient documentation

## 2015-09-24 DIAGNOSIS — Y9289 Other specified places as the place of occurrence of the external cause: Secondary | ICD-10-CM | POA: Insufficient documentation

## 2015-09-24 MED ORDER — TRAMADOL HCL 50 MG PO TABS: 50.0000 mg | ORAL_TABLET | Freq: Four times a day (QID) | ORAL | Status: AC | PRN

## 2015-09-24 NOTE — ED Notes (Signed)
Pt presents to ED via POV from personal home with c/o of left side ankle injury. Pt states she tripped over a step walking down a staircase when she landed on affected site to the ground. Pt denies LOC and blurry vision. Pt alert and oriented x4, arrived to treatment room in wheelchair. Ice applied to affected area upon arrival to treatment room.

## 2015-09-24 NOTE — ED Provider Notes (Signed)
[facility] Emergency Department Provider Note  ____________________________________________  Time seen: On arrival  I have reviewed the triage vital signs and the nursing notes.   HISTORY  Chief Complaint No chief complaint on file.    HPI Miranda Fields is a 24 y.o. female who presents with complaints of left ankle pain. She reports she was walking up her front steps and tripped and inverted her left ankle. She complains of pain on the medial and lateral surface. She is able to ambulate with significant pain. No other injuries    No past medical history on file.  There are no active problems to display for this patient.   Past Surgical History  Procedure Laterality Date  . Ankle surgery Right     Current Outpatient Rx  Name  Route  Sig  Dispense  Refill  . azithromycin (ZITHROMAX Z-PAK) 250 MG tablet      Take 2 tablets (500 mg) on  Day 1,  followed by 1 tablet (250 mg) once daily on Days 2 through 5.   6 each   0   . Prenatal Multivit-Min-Fe-FA (PRENATAL VITAMINS PO)   Oral   Take by mouth.         . sulfacetamide (BLEPH-10) 10 % ophthalmic solution   Both Eyes   Place 2 drops into both eyes 4 (four) times daily.   5 mL   0     Allergies Review of patient's allergies indicates no known allergies.  No family history on file.  Social History Social History  Substance Use Topics  . Smoking status: Never Smoker   . Smokeless tobacco: Never Used  . Alcohol Use: Yes     Comment: social     Review of Systems  Constitutional: Negative for fever. Eyes: Negative for visual changes. ENT: Negative for sore throat   Genitourinary: Negative for dysuria. Musculoskeletal: Negative for back pain. Skin: Negative for rash. Neurological: Negative for headaches or focal weakness   ____________________________________________   PHYSICAL EXAM:  VITAL SIGNS: ED Triage Vitals  Enc Vitals Group     BP 09/24/15 2327 110/65 mmHg      Pulse Rate 09/24/15 2327 73     Resp 09/24/15 2327 18     Temp 09/24/15 2327 98.2 F (36.8 C)     Temp src --      SpO2 09/24/15 2327 98 %     Weight 09/24/15 2327 220 lb (99.791 kg)     Height 09/24/15 2327 5\' 4"  (1.626 m)     Head Cir --      Peak Flow --      Pain Score --      Pain Loc --      Pain Edu? --      Excl. in GC? --      Constitutional: Alert and oriented. Well appearing and in no distress. Eyes: Conjunctivae are normal.  ENT   Head: Normocephalic and atraumatic.   Mouth/Throat: Mucous membranes are moist. Cardiovascular: Normal rate, regular rhythm.  Respiratory: Normal respiratory effort without tachypnea nor retractions.  Gastrointestinal: Soft and non-tender in all quadrants. No distention. There is no CVA tenderness. Musculoskeletal: Tenderness to palpation on the medial and lateral malleolus, minimal swelling, no bruising, 2+ distal pulses. No bony abnormalities Neurologic:  Normal speech and language. No gross focal neurologic deficits are appreciated. Skin:  Skin is warm, dry and intact. No rash noted. Psychiatric: Mood and affect are normal. Patient exhibits appropriate insight and judgment.  ____________________________________________    LABS (pertinent positives/negatives)  Labs Reviewed - No data to display  ____________________________________________     ____________________________________________    RADIOLOGY I have personally reviewed any xrays that were ordered on this patient: No acute fracture noted  ____________________________________________   PROCEDURES  Procedure(s) performed: yes  SPLINT APPLICATION Date/Time: 11:57 PM Authorized by: Jene Every Consent: Verbal consent obtained. Risks and benefits: risks, benefits and alternatives were discussed Consent given by: patient Splint applied by: orthopedic technician Location details: left ankle  Splint type: posterior  Supplies used: orthoglass,  ace Post-procedure: The splinted body part was neurovascularly unchanged following the procedure. Patient tolerance: Patient tolerated the procedure well with no immediate complications.      ____________________________________________   INITIAL IMPRESSION / ASSESSMENT AND PLAN / ED COURSE  Pertinent labs & imaging results that were available during my care of the patient were reviewed by me and considered in my medical decision making (see chart for details).  Patient with negative x-ray, consistent with ankle sprain, patient splinted with crutches provided. Follow-up with orthopedist as needed  ____________________________________________   FINAL CLINICAL IMPRESSION(S) / ED DIAGNOSES  Final diagnoses:  Ankle sprain, left, initial encounter     Jene Every, MD 09/25/15 0002

## 2015-09-24 NOTE — Discharge Instructions (Signed)
Ankle Sprain  An ankle sprain is an injury to the strong, fibrous tissues (ligaments) that hold the bones of your ankle joint together.   CAUSES  An ankle sprain is usually caused by a fall or by twisting your ankle. Ankle sprains most commonly occur when you step on the outer edge of your foot, and your ankle turns inward. People who participate in sports are more prone to these types of injuries.   SYMPTOMS    Pain in your ankle. The pain may be present at rest or only when you are trying to stand or walk.   Swelling.   Bruising. Bruising may develop immediately or within 1 to 2 days after your injury.   Difficulty standing or walking, particularly when turning corners or changing directions.  DIAGNOSIS   Your caregiver will ask you details about your injury and perform a physical exam of your ankle to determine if you have an ankle sprain. During the physical exam, your caregiver will press on and apply pressure to specific areas of your foot and ankle. Your caregiver will try to move your ankle in certain ways. An X-ray exam may be done to be sure a bone was not broken or a ligament did not separate from one of the bones in your ankle (avulsion fracture).   TREATMENT   Certain types of braces can help stabilize your ankle. Your caregiver can make a recommendation for this. Your caregiver may recommend the use of medicine for pain. If your sprain is severe, your caregiver may refer you to a surgeon who helps to restore function to parts of your skeletal system (orthopedist) or a physical therapist.  HOME CARE INSTRUCTIONS    Apply ice to your injury for 1-2 days or as directed by your caregiver. Applying ice helps to reduce inflammation and pain.    Put ice in a plastic bag.    Place a towel between your skin and the bag.    Leave the ice on for 15-20 minutes at a time, every 2 hours while you are awake.   Only take over-the-counter or prescription medicines for pain, discomfort, or fever as directed by  your caregiver.   Elevate your injured ankle above the level of your heart as much as possible for 2-3 days.   If your caregiver recommends crutches, use them as instructed. Gradually put weight on the affected ankle. Continue to use crutches or a cane until you can walk without feeling pain in your ankle.   If you have a plaster splint, wear the splint as directed by your caregiver. Do not rest it on anything harder than a pillow for the first 24 hours. Do not put weight on it. Do not get it wet. You may take it off to take a shower or bath.   You may have been given an elastic bandage to wear around your ankle to provide support. If the elastic bandage is too tight (you have numbness or tingling in your foot or your foot becomes cold and blue), adjust the bandage to make it comfortable.   If you have an air splint, you may blow more air into it or let air out to make it more comfortable. You may take your splint off at night and before taking a shower or bath. Wiggle your toes in the splint several times per day to decrease swelling.  SEEK MEDICAL CARE IF:    You have rapidly increasing bruising or swelling.   Your toes feel   extremely cold or you lose feeling in your foot.   Your pain is not relieved with medicine.  SEEK IMMEDIATE MEDICAL CARE IF:   Your toes are numb or blue.   You have severe pain that is increasing.  MAKE SURE YOU:    Understand these instructions.   Will watch your condition.   Will get help right away if you are not doing well or get worse.     This information is not intended to replace advice given to you by your health care provider. Make sure you discuss any questions you have with your health care provider.     Document Released: 10/16/2005 Document Revised: 11/06/2014 Document Reviewed: 10/28/2011  Elsevier Interactive Patient Education 2016 Elsevier Inc[employer]

## 2015-09-25 NOTE — ED Notes (Signed)
Patient with no complaints at this time. Respirations even and unlabored. Skin warm/dry. Discharge instructions reviewed with patient at this time. Patient given opportunity to voice concerns/ask questions. Patient discharged at this time and left Emergency Department, via wheelchair.   

## 2015-11-14 ENCOUNTER — Encounter: Payer: Self-pay | Admitting: Radiology

## 2015-11-14 ENCOUNTER — Emergency Department: Payer: Self-pay

## 2015-11-14 ENCOUNTER — Emergency Department
Admission: EM | Admit: 2015-11-14 | Discharge: 2015-11-14 | Disposition: A | Discharge status: Home / Self Care | Payer: Self-pay | Attending: Emergency Medicine | Admitting: Emergency Medicine

## 2015-11-14 DIAGNOSIS — F419 Anxiety disorder, unspecified: Secondary | ICD-10-CM | POA: Insufficient documentation

## 2015-11-14 DIAGNOSIS — Z792 Long term (current) use of antibiotics: Secondary | ICD-10-CM | POA: Insufficient documentation

## 2015-11-14 DIAGNOSIS — R51 Headache: Secondary | ICD-10-CM | POA: Insufficient documentation

## 2015-11-14 DIAGNOSIS — R200 Anesthesia of skin: Secondary | ICD-10-CM

## 2015-11-14 DIAGNOSIS — Z3202 Encounter for pregnancy test, result negative: Secondary | ICD-10-CM | POA: Insufficient documentation

## 2015-11-14 DIAGNOSIS — R29898 Other symptoms and signs involving the musculoskeletal system: Secondary | ICD-10-CM

## 2015-11-14 DIAGNOSIS — R202 Paresthesia of skin: Secondary | ICD-10-CM | POA: Insufficient documentation

## 2015-11-14 DIAGNOSIS — R531 Weakness: Secondary | ICD-10-CM | POA: Insufficient documentation

## 2015-11-14 LAB — CBC
HEMATOCRIT: 39.1 % (ref 35.0–47.0)
HEMOGLOBIN: 12.9 g/dL (ref 12.0–16.0)
MCH: 28.1 pg (ref 26.0–34.0)
MCHC: 32.9 g/dL (ref 32.0–36.0)
MCV: 85.4 fL (ref 80.0–100.0)
Platelets: 236 10*3/uL (ref 150–440)
RBC: 4.58 MIL/uL (ref 3.80–5.20)
RDW: 13.2 % (ref 11.5–14.5)
WBC: 6.3 10*3/uL (ref 3.6–11.0)

## 2015-11-14 LAB — URINALYSIS COMPLETE WITH MICROSCOPIC (ARMC ONLY)
BILIRUBIN URINE: NEGATIVE
GLUCOSE, UA: NEGATIVE mg/dL
KETONES UR: NEGATIVE mg/dL
NITRITE: NEGATIVE
PH: 5 (ref 5.0–8.0)
Protein, ur: NEGATIVE mg/dL
Specific Gravity, Urine: 1.017 (ref 1.005–1.030)

## 2015-11-14 LAB — PROTIME-INR
INR: 1.05
Prothrombin Time: 13.9 seconds (ref 11.4–15.0)

## 2015-11-14 LAB — COMPREHENSIVE METABOLIC PANEL
ALT: 18 U/L (ref 14–54)
AST: 20 U/L (ref 15–41)
Albumin: 3.8 g/dL (ref 3.5–5.0)
Alkaline Phosphatase: 56 U/L (ref 38–126)
Anion gap: 6 (ref 5–15)
BILIRUBIN TOTAL: 0.6 mg/dL (ref 0.3–1.2)
BUN: 10 mg/dL (ref 6–20)
CALCIUM: 8.8 mg/dL — AB (ref 8.9–10.3)
CHLORIDE: 106 mmol/L (ref 101–111)
CO2: 27 mmol/L (ref 22–32)
CREATININE: 0.72 mg/dL (ref 0.44–1.00)
Glucose, Bld: 100 mg/dL — ABNORMAL HIGH (ref 65–99)
Potassium: 3.2 mmol/L — ABNORMAL LOW (ref 3.5–5.1)
Sodium: 139 mmol/L (ref 135–145)
TOTAL PROTEIN: 7.3 g/dL (ref 6.5–8.1)

## 2015-11-14 LAB — POCT PREGNANCY, URINE: Preg Test, Ur: NEGATIVE

## 2015-11-14 LAB — DIFFERENTIAL
BASOS PCT: 3 %
Basophils Absolute: 0.2 10*3/uL — ABNORMAL HIGH (ref 0–0.1)
Eosinophils Absolute: 0.1 10*3/uL (ref 0–0.7)
Eosinophils Relative: 2 %
LYMPHS ABS: 1.3 10*3/uL (ref 1.0–3.6)
Lymphocytes Relative: 21 %
MONO ABS: 0.4 10*3/uL (ref 0.2–0.9)
MONOS PCT: 7 %
NEUTROS ABS: 4.3 10*3/uL (ref 1.4–6.5)
Neutrophils Relative %: 67 %

## 2015-11-14 LAB — URINE DRUG SCREEN, QUALITATIVE (ARMC ONLY)
Amphetamines, Ur Screen: NOT DETECTED
BARBITURATES, UR SCREEN: NOT DETECTED
BENZODIAZEPINE, UR SCRN: NOT DETECTED
CANNABINOID 50 NG, UR ~~LOC~~: NOT DETECTED
Cocaine Metabolite,Ur ~~LOC~~: NOT DETECTED
MDMA (Ecstasy)Ur Screen: NOT DETECTED
METHADONE SCREEN, URINE: NOT DETECTED
Opiate, Ur Screen: NOT DETECTED
Phencyclidine (PCP) Ur S: NOT DETECTED
TRICYCLIC, UR SCREEN: NOT DETECTED

## 2015-11-14 LAB — ETHANOL

## 2015-11-14 LAB — APTT: aPTT: 31 seconds (ref 24–36)

## 2015-11-14 MED ORDER — ONDANSETRON HCL 4 MG/2ML IJ SOLN
4.0000 mg | Freq: Once | INTRAMUSCULAR | Status: AC
  Administered 2015-11-14: 4 mg via INTRAVENOUS

## 2015-11-14 MED ORDER — ASPIRIN 325 MG PO TABS
325.0000 mg | ORAL_TABLET | Freq: Once | ORAL | Status: AC
  Administered 2015-11-14: 325 mg via ORAL
  Filled 2015-11-14: qty 1

## 2015-11-14 MED ORDER — MORPHINE SULFATE (PF) 4 MG/ML IV SOLN
4.0000 mg | Freq: Once | INTRAVENOUS | Status: DC
  Filled 2015-11-14: qty 1

## 2015-11-14 MED ORDER — POTASSIUM CHLORIDE CRYS ER 20 MEQ PO TBCR
40.0000 meq | EXTENDED_RELEASE_TABLET | Freq: Once | ORAL | Status: AC
  Administered 2015-11-14: 40 meq via ORAL
  Filled 2015-11-14: qty 2

## 2015-11-14 MED ORDER — BUTALBITAL-APAP-CAFFEINE 50-325-40 MG PO TABS
1.0000 | ORAL_TABLET | Freq: Once | ORAL | Status: AC
Start: 2015-11-14 — End: 2015-11-14
  Administered 2015-11-14: 1 via ORAL
  Filled 2015-11-14: qty 1

## 2015-11-14 MED ORDER — LORAZEPAM 0.5 MG PO TABS: 0.5000 mg | ORAL_TABLET | Freq: Three times a day (TID) | ORAL | Status: AC | PRN

## 2015-11-14 MED ORDER — METOCLOPRAMIDE HCL 5 MG/ML IJ SOLN
10.0000 mg | Freq: Once | INTRAMUSCULAR | Status: AC
  Administered 2015-11-14: 10 mg via INTRAVENOUS
  Filled 2015-11-14: qty 2

## 2015-11-14 MED ORDER — MORPHINE SULFATE (PF) 4 MG/ML IV SOLN
4.0000 mg | Freq: Once | INTRAVENOUS | Status: AC
  Administered 2015-11-14: 4 mg via INTRAVENOUS
  Filled 2015-11-14: qty 1

## 2015-11-14 MED ORDER — DIPHENHYDRAMINE HCL 50 MG/ML IJ SOLN
25.0000 mg | Freq: Once | INTRAMUSCULAR | Status: AC
  Administered 2015-11-14: 25 mg via INTRAVENOUS
  Filled 2015-11-14: qty 1

## 2015-11-14 MED ORDER — KETOROLAC TROMETHAMINE 30 MG/ML IJ SOLN
30.0000 mg | Freq: Once | INTRAMUSCULAR | Status: AC
  Administered 2015-11-14: 30 mg via INTRAVENOUS
  Filled 2015-11-14: qty 1

## 2015-11-14 MED ORDER — ONDANSETRON HCL 4 MG/2ML IJ SOLN
4.0000 mg | Freq: Once | INTRAMUSCULAR | Status: DC
  Filled 2015-11-14: qty 2

## 2015-11-14 MED ORDER — IBUPROFEN 800 MG PO TABS: 800.0000 mg | ORAL_TABLET | Freq: Three times a day (TID) | ORAL | Status: DC | PRN

## 2015-11-14 MED ORDER — MORPHINE SULFATE (PF) 4 MG/ML IV SOLN
4.0000 mg | Freq: Once | INTRAVENOUS | Status: AC
Start: 2015-11-14 — End: 2015-11-14
  Administered 2015-11-14: 4 mg via INTRAVENOUS

## 2015-11-14 NOTE — ED Notes (Signed)
Pt to MRI

## 2015-11-14 NOTE — ED Provider Notes (Signed)
[facility] Emergency Department Provider Note   ____________________________________________  Time seen: Upon arrival I have reviewed the triage vital signs and the triage nursing note.  HISTORY  Chief Complaint Code Stroke   Historian Patient  HPI Miranda Fields is a 25 y.o. female with no significant past medical history, is here with acute onset headache that she woke up with this morning. She doesn't have a history of headaches or migraines. Around 10:30 she decided to lie back down at that point she noticed acute onset of right-sided face and arm and leg numbness. At that point she decided to come to the ED for evaluation.No family history of stroke. No personal history of stroke or cardiac disease. Patient states that she thinks her right arm is somewhat weak. No recent fevers. No chest pain or trouble breathing.    History reviewed. No pertinent past medical history.  There are no active problems to display for this patient.   Past Surgical History  Procedure Laterality Date  . Ankle surgery Right     Current Outpatient Rx  Name  Route  Sig  Dispense  Refill  . azithromycin (ZITHROMAX Z-PAK) 250 MG tablet      Take 2 tablets (500 mg) on  Day 1,  followed by 1 tablet (250 mg) once daily on Days 2 through 5.   6 each   0   . traMADol (ULTRAM) 50 MG tablet   Oral   Take 1 tablet (50 mg total) by mouth every 6 (six) hours as needed.   20 tablet   0     Allergies Review of patient's allergies indicates no known allergies.  No family history on file.  Social History Social History  Substance Use Topics  . Smoking status: Never Smoker   . Smokeless tobacco: Never Used  . Alcohol Use: Yes     Comment: social     Review of Systems  Constitutional: Negative for fever. Eyes: Negative for visual changes. ENT: Negative for sore throat. Cardiovascular: Negative for chest pain. Respiratory: Negative for shortness of  breath. Gastrointestinal: Negative for abdominal pain, vomiting and diarrhea. Genitourinary: Negative for dysuria. Musculoskeletal: Negative for back pain. Skin: Negative for rash. Neurological: Positive for headache. 10 point Review of Systems otherwise negative ____________________________________________   PHYSICAL EXAM:  VITAL SIGNS: ED Triage Vitals  Enc Vitals Group     BP 11/14/15 1215 149/96 mmHg     Pulse Rate 11/14/15 1230 73     Resp 11/14/15 1230 24     Temp 11/14/15 1230 98.8 F (37.1 C)     Temp Source 11/14/15 1230 Oral     SpO2 11/14/15 1230 99 %     Weight 11/14/15 1220 242 lb 9.6 oz (110.043 kg)     Height --      Head Cir --      Peak Flow --      Pain Score 11/14/15 1227 8     Pain Loc --      Pain Edu? --      Excl. in GC? --      Constitutional: Alert and oriented. Well appearing and in no distress. Slightly anxious. Eyes: Conjunctivae are normal. PERRL. Normal extraocular movements. ENT   Head: Normocephalic and atraumatic.   Nose: No congestion/rhinnorhea.   Mouth/Throat: Mucous membranes are moist.   Neck: No stridor. Cardiovascular/Chest: Normal rate, regular rhythm.  No murmurs, rubs, or gallops. Respiratory: Normal respiratory effort without tachypnea nor retractions. Breath sounds  are clear and equal bilaterally. No wheezes/rales/rhonchi. Gastrointestinal: Soft. No distention, no guarding, no rebound. Nontender.    Genitourinary/rectal:Deferred Musculoskeletal: Nontender with normal range of motion in all extremities. No joint effusions.  No lower extremity tenderness.  No edema. Neurologic: Paresthesia right face. No facial droop. Normal speech and language. No aphasia or dysarthria. No pronator drift. 4-5 grip strength on the right, 5 out of 5 biceps and triceps bilaterally. 4-5 strength with right lower extremity hip flexion, but no drift. Paresthesia right arm and right leg. Skin:  Skin is warm, dry and intact. No rash  noted. Psychiatric: Mood and affect are normal. Speech and behavior are normal. Patient exhibits appropriate insight and judgment.  ____________________________________________   EKG I, Governor Rooks, MD, the attending physician have personally viewed and interpreted all ECGs.  66 bpm. Normal sinus rhythm. Narrow QRS. Normal axis. Normal ST and T-wave ____________________________________________  LABS (pertinent positives/negatives)  Urine pregnancy test negative Your drug screen negative Urinalysis 1+ leukocytes, rare bacteria and otherwise negative Alcohol less than 5 CBC within normal limits Complete metabolic panel significant only for potassium 3.2  ____________________________________________  RADIOLOGY All Xrays were viewed by me. Imaging interpreted by Radiologist.  CT head without contrast: Normal head CT. Mild sinus inflammation. __________________________________________  PROCEDURES  Procedure(s) performed: None  Critical Care performed: CRITICAL CARE Performed by: Governor Rooks   Total critical care time: 45 minutes  Critical care time was exclusive of separately billable procedures and treating other patients.  Critical care was necessary to treat or prevent imminent or life-threatening deterioration.  Critical care was time spent personally by me on the following activities: development of treatment plan with patient and/or surrogate as well as nursing, discussions with consultants, evaluation of patient's response to treatment, examination of patient, obtaining history from patient or surrogate, ordering and performing treatments and interventions, ordering and review of laboratory studies, ordering and review of radiographic studies, pulse oximetry and re-evaluation of patient's condition.   ____________________________________________   ED COURSE / ASSESSMENT AND PLAN  CONSULTATIONS: Specialist on-call tele neurology - this is a patient symptoms are  minor, and given her age and symptomatology may be more likely to be complex migraine than acute stroke, and especially with minor and sensory symptoms would not be a candidate or recommend TPA.  Consult to [facility] doc hospitalist for admission.    Pertinent labs & imaging results that were available during my care of the patient were reviewed by me and considered in my medical decision making (see chart for details).   Based on patient's symptomatology she was initiated as a code stroke by triage. When I assessed the patient she states that she started with a headache and then had acute onset of right side numbness and then later noticed that her right grip was also weak. On exam she did actually have some weakness of her right grip and right hip flexion with some heaviness of her leg with walking for the patient.  No history of chronic headaches or migraines. No history of stroke or vascular diseases.  Tele neurologist evaluated the patient and did not recommend TPA given her age and more likely diagnosis may be complex migraine or other rather than acute stroke, and her symptoms are minor and more sensory.  He did however recommend hospital admission and rule out stroke workup including MRI and MRA of the head and neck. These orders were entered. I discussed the case with the hospitalist.  Patient was given 325 aspirin. She was also  treated for acute headache, with some minor improvement per the patient in headache, weakness and numbness, but not completely resolved.   Patient / Family / Caregiver informed of clinical course, medical decision-making process, and agree with plan.    ___________________________________________   FINAL CLINICAL IMPRESSION(S) / ED DIAGNOSES   Final diagnoses:  Right leg weakness  Weakness of right arm  Numbness and tingling of right arm and leg  Numbness and tingling of right face              Note: This dictation was prepared with  Dragon dictation. Any transcriptional errors that result from this process are unintentional   Governor Rooks, MD 11/14/15 1437

## 2015-11-14 NOTE — ED Provider Notes (Addendum)
IMPRESSION: MRI HEAD IMPRESSION:  1. Normal brain MRI. No acute intracranial process. 2. Mild paranasal sinus disease as above.  MRA HEAD IMPRESSION:  Normal intracranial MRA.  MRA NECK IMPRESSION:  Normal MRA of the neck with no definite abnormality identified.   Patient with normal MRI/MRA. Patient will likely be discharged with outpatient treatment for anxiety. Patient be given Ativan to take as needed when she feels stressed and I will give her Motrin for headache.  Miranda FilbertJonathan E Williams, MD 11/14/15 1942  Miranda FilbertJonathan E Williams, MD 11/14/15 2024

## 2015-11-14 NOTE — Discharge Instructions (Signed)
Paresthesia  Paresthesia is an abnormal burning or prickling sensation. This sensation is generally felt in the hands, arms, legs, or feet. However, it may occur in any part of the body. Usually, it is not painful. The feeling may be described as:  · Tingling or numbness.  · Pins and needles.  · Skin crawling.  · Buzzing.  · Limbs falling asleep.  · Itching.  Most people experience temporary (transient) paresthesia at some time in their lives. Paresthesia may occur when you breathe too quickly (hyperventilation). It can also occur without any apparent cause. Commonly, paresthesia occurs when pressure is placed on a nerve. The sensation quickly goes away after the pressure is removed. For some people, however, paresthesia is a long-lasting (chronic) condition that is caused by an underlying disorder. If you continue to have paresthesia, you may need further medical evaluation.  HOME CARE INSTRUCTIONS  Watch your condition for any changes. Taking the following actions may help to lessen any discomfort that you are feeling:  · Avoid drinking alcohol.  · Try acupuncture or massage to help relieve your symptoms.  · Keep all follow-up visits as directed by your health care provider. This is important.  SEEK MEDICAL CARE IF:  · You continue to have episodes of paresthesia.  · Your burning or prickling feeling gets worse when you walk.  · You have pain, cramps, or dizziness.  · You develop a rash.  SEEK IMMEDIATE MEDICAL CARE IF:  · You feel weak.  · You have trouble walking or moving.  · You have problems with speech, understanding, or vision.  · You feel confused.  · You cannot control your bladder or bowel movements.  · You have numbness after an injury.  · You faint.     This information is not intended to replace advice given to you by your health care provider. Make sure you discuss any questions you have with your health care provider.     Document Released: 10/06/2002 Document Revised: 03/02/2015 Document Reviewed:  10/12/2014  Elsevier Interactive Patient Education ©2016 Elsevier Inc.    Panic Attacks  Panic attacks are sudden, short-lived surges of severe anxiety, fear, or discomfort. They may occur for no reason when you are relaxed, when you are anxious, or when you are sleeping. Panic attacks may occur for a number of reasons:   · Healthy people occasionally have panic attacks in extreme, life-threatening situations, such as war or natural disasters. Normal anxiety is a protective mechanism of the body that helps us react to danger (fight or flight response).  · Panic attacks are often seen with anxiety disorders, such as panic disorder, social anxiety disorder, generalized anxiety disorder, and phobias. Anxiety disorders cause excessive or uncontrollable anxiety. They may interfere with your relationships or other life activities.  · Panic attacks are sometimes seen with other mental illnesses, such as depression and posttraumatic stress disorder.  · Certain medical conditions, prescription medicines, and drugs of abuse can cause panic attacks.  SYMPTOMS   Panic attacks start suddenly, peak within 20 minutes, and are accompanied by four or more of the following symptoms:  · Pounding heart or fast heart rate (palpitations).  · Sweating.  · Trembling or shaking.  · Shortness of breath or feeling smothered.  · Feeling choked.  · Chest pain or discomfort.  · Nausea or strange feeling in your stomach.  · Dizziness, light-headedness, or feeling like you will faint.  · Chills or hot flushes.  · Numbness or tingling in your   Although panic attacks can be very scary, they are not life threatening. DIAGNOSIS  Panic attacks are diagnosed through an assessment  by your health care provider. Your health care provider will ask questions about your symptoms, such as where and when they occurred. Your health care provider will also ask about your medical history and use of alcohol and drugs, including prescription medicines. Your health care provider may order blood tests or other studies to rule out a serious medical condition. Your health care provider may refer you to a mental health professional for further evaluation. TREATMENT   Most healthy people who have one or two panic attacks in an extreme, life-threatening situation will not require treatment.  The treatment for panic attacks associated with anxiety disorders or other mental illness typically involves counseling with a mental health professional, medicine, or a combination of both. Your health care provider will help determine what treatment is best for you.  Panic attacks due to physical illness usually go away with treatment of the illness. If prescription medicine is causing panic attacks, talk with your health care provider about stopping the medicine, decreasing the dose, or substituting another medicine.  Panic attacks due to alcohol or drug abuse go away with abstinence. Some adults need professional help in order to stop drinking or using drugs. HOME CARE INSTRUCTIONS   Take all medicines as directed by your health care provider.   Schedule and attend follow-up visits as directed by your health care provider. It is important to keep all your appointments. SEEK MEDICAL CARE IF:  You are not able to take your medicines as prescribed.  Your symptoms do not improve or get worse. SEEK IMMEDIATE MEDICAL CARE IF:   You experience panic attack symptoms that are different than your usual symptoms.  You have serious thoughts about hurting yourself or others.  You are taking medicine for panic attacks and have a serious side effect. MAKE SURE YOU:  Understand these instructions.  Will  watch your condition.  Will get help right away if you are not doing well or get worse.   This information is not intended to replace advice given to you by your health care provider. Make sure you discuss any questions you have with your health care provider.   Document Released: 10/16/2005 Document Revised: 10/21/2013 Document Reviewed: 05/30/2013 Elsevier Interactive Patient Education [employer]2016 Elsevier Inc.

## 2015-11-14 NOTE — ED Notes (Signed)
Patient with no complaints at this time. Respirations even and unlabored. Skin warm/dry. Discharge instructions reviewed with patient at this time. Patient given opportunity to voice concerns/ask questions. IV removed per policy and band-aid applied to site. Patient discharged at this time and left Emergency Department, via wheelchair.   

## 2016-01-14 ENCOUNTER — Encounter: Payer: Self-pay | Admitting: Emergency Medicine

## 2016-01-14 ENCOUNTER — Emergency Department
Admission: EM | Admit: 2016-01-14 | Discharge: 2016-01-14 | Disposition: A | Discharge status: Home / Self Care | Payer: Medicaid Other | Attending: Emergency Medicine | Admitting: Emergency Medicine

## 2016-01-14 DIAGNOSIS — Z3201 Encounter for pregnancy test, result positive: Secondary | ICD-10-CM | POA: Diagnosis not present

## 2016-01-14 DIAGNOSIS — R109 Unspecified abdominal pain: Secondary | ICD-10-CM | POA: Diagnosis present

## 2016-01-14 DIAGNOSIS — F1210 Cannabis abuse, uncomplicated: Secondary | ICD-10-CM | POA: Insufficient documentation

## 2016-01-14 DIAGNOSIS — Z3A01 Less than 8 weeks gestation of pregnancy: Secondary | ICD-10-CM | POA: Insufficient documentation

## 2016-01-14 LAB — URINALYSIS COMPLETE WITH MICROSCOPIC (ARMC ONLY)
BACTERIA UA: NONE SEEN
Bilirubin Urine: NEGATIVE
Glucose, UA: NEGATIVE mg/dL
Ketones, ur: NEGATIVE mg/dL
Nitrite: NEGATIVE
PH: 6 (ref 5.0–8.0)
PROTEIN: NEGATIVE mg/dL
Specific Gravity, Urine: 1.024 (ref 1.005–1.030)

## 2016-01-14 LAB — POCT PREGNANCY, URINE: PREG TEST UR: POSITIVE — AB

## 2016-01-14 NOTE — ED Notes (Signed)
States she developed some vaginal bleeding 2 days ago  With some abd pain which is sharp at time  Pos. N/v  States she took 5 home preg test which were positive

## 2016-01-14 NOTE — ED Provider Notes (Signed)
[facility] Emergency Department Provider Note ____________________________________________  Time seen: 1905  I have reviewed the triage vital signs and the nursing notes.  HISTORY  Chief Complaint  Abdominal Pain  HPI Miranda Fields is a 25 y.o. female presents to the ED for confirmation of pregnancy following 5 home pregnancy test at home. She reports her LMP was February 17. She describes that she had a nonproductive sexual encounter about 2 weeks prior. Since that time she has noticed some mild breast tenderness, some increased fatigue, as well as some spotty, dark brown vaginal discharge. She describes that today she had some mild nausea when her manager ordered food into the office. She denies any pelvic pain, flank pain, or vaginal discharge. She had some fleeting upper abdominal pain that was sharp at the time. She denies any current abdominal pain. She was also evaluated about a week and a half prior at the health Department for vaginal discharge. She is reported that the pelvic exam was normal at the time. She denies any dysuria, hematuria, urinary frequency.Patient is in the room eating a steak and cheese sub sandwhich during this interview. She rates her overall discomfort 8/10 triage.  History reviewed. No pertinent past medical history.  There are no active problems to display for this patient.  Past Surgical History  Procedure Laterality Date  . Ankle surgery Right     Current Outpatient Rx  Name  Route  Sig  Dispense  Refill  . azithromycin (ZITHROMAX Z-PAK) 250 MG tablet      Take 2 tablets (500 mg) on  Day 1,  followed by 1 tablet (250 mg) once daily on Days 2 through 5.   6 each   0   . ibuprofen (ADVIL,MOTRIN) 800 MG tablet   Oral   Take 1 tablet (800 mg total) by mouth every 8 (eight) hours as needed.   30 tablet   0   . LORazepam (ATIVAN) 0.5 MG tablet   Oral   Take 1 tablet (0.5 mg total) by mouth every 8 (eight) hours as needed  for anxiety.   20 tablet   0   . traMADol (ULTRAM) 50 MG tablet   Oral   Take 1 tablet (50 mg total) by mouth every 6 (six) hours as needed.   20 tablet   0     Allergies Review of patient's allergies indicates no known allergies.  No family history on file.  Social History Social History  Substance Use Topics  . Smoking status: Never Smoker   . Smokeless tobacco: Never Used  . Alcohol Use: Yes     Comment: social     Review of Systems  Constitutional: Negative for fever. Eyes: Negative for visual changes. ENT: Negative for sore throat. Cardiovascular: Negative for chest pain. Respiratory: Negative for shortness of breath. Gastrointestinal: Negative for abdominal pain, vomiting and diarrhea. Genitourinary: Negative for dysuria. Musculoskeletal: Negative for back pain. Skin: Negative for rash. Neurological: Negative for headaches, focal weakness or numbness. ____________________________________________  PHYSICAL EXAM:  VITAL SIGNS: ED Triage Vitals  Enc Vitals Group     BP 01/14/16 1728 158/87 mmHg     Pulse Rate 01/14/16 1728 66     Resp 01/14/16 1728 18     Temp 01/14/16 1728 99.2 F (37.3 C)     Temp Source 01/14/16 1728 Oral     SpO2 01/14/16 1728 100 %     Weight 01/14/16 1729 235 lb (106.595 kg)     Height  01/14/16 1728 4\' 8"  (1.422 m)     Head Cir --      Peak Flow --      Pain Score 01/14/16 1729 7     Pain Loc --      Pain Edu? --      Excl. in GC? --    Constitutional: Alert and oriented. Well appearing and in no distress. Head: Normocephalic and atraumatic. Eyes: Conjunctivae are normal. PERRL. Normal extraocular movements Hematological/Lymphatic/Immunological: No cervical lymphadenopathy. Cardiovascular: Normal rate, regular rhythm.  Respiratory: Normal respiratory effort. No wheezes/rales/rhonchi. Gastrointestinal: Soft and nontender. No distention, tenderness, guarding, organomegaly. No CVA tenderness. GU: Declined Musculoskeletal:  Nontender with normal range of motion in all extremities.  Neurologic:  Normal gait without ataxia. Normal speech and language. No gross focal neurologic deficits are appreciated. Skin:  Skin is warm, dry and intact. No rash noted. Psychiatric: Mood and affect are normal. Patient exhibits appropriate insight and judgment. ____________________________________________    LABS (pertinent positives/negatives) Labs Reviewed  URINALYSIS COMPLETEWITH MICROSCOPIC ([facility] ONLY) - Abnormal; Notable for the following:    Color, Urine YELLOW (*)    APPearance HAZY (*)    Hgb urine dipstick 1+ (*)    Leukocytes, UA 1+ (*)    Squamous Epithelial / LPF 6-30 (*)    All other components within normal limits  POCT PREGNANCY, URINE - Abnormal; Notable for the following:    Preg Test, Ur POSITIVE (*)    All other components within normal limits  POC URINE PREG, ED  ___________________________________________  INITIAL IMPRESSION / ASSESSMENT AND PLAN / ED COURSE  Patient with a normal exam. Pregnancy confirmed with urine pregnancy test and ED. Patient reassured with her normal exam and declined any further pelvic exam today. She will be discharged with instructions to [facility] or [facility] for routine prenatal care. ____________________________________________  FINAL CLINICAL IMPRESSION(S) / ED DIAGNOSES  Final diagnoses:  Positive urine pregnancy test      Lissa HoardJenise V Bacon Zoua Caporaso, PA-C 01/15/16 0027  Myrna Blazeravid Matthew Schaevitz, MD 01/16/16 0025

## 2016-01-14 NOTE — Discharge Instructions (Signed)
Prenatal Care °WHAT IS PRENATAL CARE?  °Prenatal care is the process of caring for a pregnant woman before she gives birth. Prenatal care makes sure that she and her baby remain as healthy as possible throughout pregnancy. Prenatal care may be provided by a midwife, family practice health care provider, or a childbirth and pregnancy specialist (obstetrician). Prenatal care may include physical examinations, testing, treatments, and education on nutrition, lifestyle, and social support services. °WHY IS PRENATAL CARE SO IMPORTANT?  °Early and consistent prenatal care increases the chance that you and your baby will remain healthy throughout your pregnancy. This type of care also decreases a baby's risk of being born too early (prematurely), or being born smaller than expected (small for gestational age). Any underlying medical conditions you may have that could pose a risk during your pregnancy are discussed during prenatal care visits. You will also be monitored regularly for any new conditions that may arise during your pregnancy so they can be treated quickly and effectively. °WHAT HAPPENS DURING PRENATAL CARE VISITS? °Prenatal care visits may include the following: °Discussion °Tell your health care provider about any new signs or symptoms you have experienced since your last visit. These might include: °· Nausea or vomiting. °· Increased or decreased level of energy. °· Difficulty sleeping. °· Back or leg pain. °· Weight changes. °· Frequent urination. °· Shortness of breath with physical activity. °· Changes in your skin, such as the development of a rash or itchiness. °· Vaginal discharge or bleeding. °· Feelings of excitement or nervousness. °· Changes in your baby's movements. °You may want to write down any questions or topics you want to discuss with your health care provider and bring them with you to your appointment. °Examination °During your first prenatal care visit, you will likely have a complete  physical exam. Your health care provider will often examine your vagina, cervix, and the position of your uterus, as well as check your heart, lungs, and other body systems. As your pregnancy progresses, your health care provider will measure the size of your uterus and your baby's position inside your uterus. He or she may also examine you for early signs of labor. Your prenatal visits may also include checking your blood pressure and, after about 10-12 weeks of pregnancy, listening to your baby's heartbeat. °Testing °Regular testing often includes: °· Urinalysis. This checks your urine for glucose, protein, or signs of infection. °· Blood count. This checks the levels of white and red blood cells in your body. °· Tests for sexually transmitted infections (STIs). Testing for STIs at the beginning of pregnancy is routinely done and is required in many states. °· Antibody testing. You will be checked to see if you are immune to certain illnesses, such as rubella, that can affect a developing fetus. °· Glucose screen. Around 24-28 weeks of pregnancy, your blood glucose level will be checked for signs of gestational diabetes. Follow-up tests may be recommended. °· Group B strep. This is a bacteria that is commonly found inside a woman's vagina. This test will inform your health care provider if you need an antibiotic to reduce the amount of this bacteria in your body prior to labor and childbirth. °· Ultrasound. Many pregnant women undergo an ultrasound screening around 18-20 weeks of pregnancy to evaluate the health of the fetus and check for any developmental abnormalities. °· HIV (human immunodeficiency virus) testing. Early in your pregnancy, you will be screened for HIV. If you are at high risk for HIV, this test   may be repeated during your third trimester of pregnancy. °You may be offered other testing based on your age, personal or family medical history, or other factors.  °HOW OFTEN SHOULD I PLAN TO SEE MY  HEALTH CARE PROVIDER FOR PRENATAL CARE? °Your prenatal care check-up schedule depends on any medical conditions you have before, or develop during, your pregnancy. If you do not have any underlying medical conditions, you will likely be seen for checkups: °· Monthly, during the first 6 months of pregnancy. °· Twice a month during months 7 and 8 of pregnancy. °· Weekly starting in the 9th month of pregnancy and until delivery. °If you develop signs of early labor or other concerning signs or symptoms, you may need to see your health care provider more often. Ask your health care provider what prenatal care schedule is best for you. °WHAT CAN I DO TO KEEP MYSELF AND MY BABY AS HEALTHY AS POSSIBLE DURING MY PREGNANCY? °· Take a prenatal vitamin containing 400 micrograms (0.4 mg) of folic acid every day. Your health care provider may also ask you to take additional vitamins such as iodine, vitamin D, iron, copper, and zinc. °· Take 1500-2000 mg of calcium daily starting at your 20th week of pregnancy until you deliver your baby. °· Make sure you are up to date on your vaccinations. Unless directed otherwise by your health care provider: °¨ You should receive a tetanus, diphtheria, and pertussis (Tdap) vaccination between the 27th and 36th week of your pregnancy, regardless of when your last Tdap immunization occurred. This helps protect your baby from whooping cough (pertussis) after he or she is born. °¨ You should receive an annual inactivated influenza vaccine (IIV) to help protect you and your baby from influenza. This can be done at any point during your pregnancy. °· Eat a well-rounded diet that includes: °¨ Fresh fruits and vegetables. °¨ Lean proteins. °¨ Calcium-rich foods such as milk, yogurt, hard cheeses, and dark, leafy greens. °¨ Whole grain breads. °· Do not eat seafood high in mercury, including: °¨ Swordfish. °¨ Tilefish. °¨ Shark. °¨ King mackerel. °¨ More than 6 oz tuna per week. °· Do not eat: °¨ Raw  or undercooked meats or eggs. °¨ Unpasteurized foods, such as soft cheeses (brie, blue, or feta), juices, and milks. °¨ Lunch meats. °¨ Hot dogs that have not been heated until they are steaming. °· Drink enough water to keep your urine clear or pale yellow. For many women, this may be 10 or more 8 oz glasses of water each day. Keeping yourself hydrated helps deliver nutrients to your baby and may prevent the start of pre-term uterine contractions. °· Do not use any tobacco products including cigarettes, chewing tobacco, or electronic cigarettes. If you need help quitting, ask your health care provider. °· Do not drink beverages containing alcohol. No safe level of alcohol consumption during pregnancy has been determined. °· Do not use any illegal drugs. These can harm your developing baby or cause a miscarriage. °· Ask your health care provider or pharmacist before taking any prescription or over-the-counter medicines, herbs, or supplements. °· Limit your caffeine intake to no more than 200 mg per day. °· Exercise. Unless told otherwise by your health care provider, try to get 30 minutes of moderate exercise most days of the week. Do not  do high-impact activities, contact sports, or activities with a high risk of falling, such as horseback riding or downhill skiing. °· Get plenty of rest. °· Avoid anything that raises your   body temperature, such as hot tubs and saunas.  If you own a cat, do not empty its litter box. Bacteria contained in cat feces can cause an infection called toxoplasmosis. This can result in serious harm to the fetus.  Stay away from chemicals such as insecticides, lead, mercury, and cleaning or paint products that contain solvents.  Do not have any X-rays taken unless medically necessary.  Take a childbirth and breastfeeding preparation class. Ask your health care provider if you need a referral or recommendation.   This information is not intended to replace advice given to you by  your health care provider. Make sure you discuss any questions you have with your health care provider.   Document Released: 10/19/2003 Document Revised: 11/06/2014 Document Reviewed: 12/31/2013 Elsevier Interactive Patient Education 2016 [employer]  Pregnancy Test Information WHAT IS A PREGNANCY TEST? A pregnancy test is used to detect the presence of human chorionic gonadotropin (hCG) in a sample of your urine or blood. hCG is a hormone produced by the cells of the placenta. The placenta is the organ that forms to nourish and support a developing baby. This test requires a sample of either blood or urine. A pregnancy test determines whether you are pregnant or not. HOW ARE PREGNANCY TESTS DONE? Pregnancy tests are done using a home pregnancy test or having a blood or urine test done at your health care provider's office.  Home pregnancy tests require a urine sample.  Most kits use a plastic testing device with a strip of paper that indicates whether there is hCG in your urine.  Follow the test instructions very carefully.  After you urinate on the test stick, markings will appear to let you know whether you are pregnant.  For best results, use your first urine of the morning. That is when the concentration of hCG is highest. Having a blood test to check for pregnancy requires a sample of blood drawn from a vein in your hand or arm. Your health care provider will send your sample to a lab for testing. Results of a pregnancy test will be positive or negative. IS ONE TYPE OF PREGNANCY TEST BETTER THAN ANOTHER? In some cases, a blood test will return a positive result even if a urine test was negative because blood tests are more sensitive. This means blood tests can detect hCG earlier than home pregnancy tests.  HOW ACCURATE ARE HOME PREGNANCY TESTS?  Both types of pregnancy tests are very accurate.  A blood test is about 98% accurate.  When you are far enough along in your pregnancy  and when used correctly, home pregnancy tests are equally accurate. CAN ANYTHING INTERFERE WITH HOME PREGNANCY TEST RESULTS?  It is possible for certain conditions to cause an inaccurate test result (false positive or falsenegative).  A false positive is a positive test result when you are not pregnant. This can happen if you:  Are taking certain medicines, including anticonvulsants or tranquilizers.  Have certain proteins in your blood.  A false negative is a negative test result when you are pregnant. This can happen if you:  Took the test before there was enough hCG to detect. A pregnancy test will not be positive in most women until 3-4 weeks after conception.  Drank a lot of liquid before the test. Diluted urine samples can sometimes give an inaccurate result.  Take certain medicines, such as water pills (diuretics) or some antihistamines. WHAT SHOULD I DO IF I HAVE A POSITIVE PREGNANCY TEST? If you  have a positive pregnancy test, schedule an appointment with your health care provider. You might need additional testing to confirm the pregnancy. In the meantime, begin taking a prenatal vitamin, stop smoking, stop drinking alcohol, and do not use street drugs. Talk to your health care provider about how to take care of yourself during your pregnancy. Ask about what to expect from the care you will need throughout pregnancy (prenatal care).   This information is not intended to replace advice given to you by your health care provider. Make sure you discuss any questions you have with your health care provider.   Document Released: 10/19/2003 Document Revised: 11/06/2014 Document Reviewed: 02/10/2014 Elsevier Interactive Patient Education 2016 [employer]  You were confirmed to be pregnant today with a urine test. Follow-up with [facility] as planned.

## 2016-01-23 ENCOUNTER — Emergency Department
Admission: EM | Admit: 2016-01-23 | Discharge: 2016-01-23 | Disposition: A | Discharge status: Home / Self Care | Payer: Medicaid Other | Attending: Emergency Medicine | Admitting: Emergency Medicine

## 2016-01-23 ENCOUNTER — Emergency Department: Payer: Medicaid Other

## 2016-01-23 ENCOUNTER — Encounter: Payer: Self-pay | Admitting: Emergency Medicine

## 2016-01-23 DIAGNOSIS — B9689 Other specified bacterial agents as the cause of diseases classified elsewhere: Secondary | ICD-10-CM

## 2016-01-23 DIAGNOSIS — O200 Threatened abortion: Secondary | ICD-10-CM | POA: Diagnosis not present

## 2016-01-23 DIAGNOSIS — O209 Hemorrhage in early pregnancy, unspecified: Secondary | ICD-10-CM | POA: Diagnosis present

## 2016-01-23 DIAGNOSIS — Z3A01 Less than 8 weeks gestation of pregnancy: Secondary | ICD-10-CM | POA: Diagnosis not present

## 2016-01-23 DIAGNOSIS — N760 Acute vaginitis: Secondary | ICD-10-CM | POA: Diagnosis not present

## 2016-01-23 LAB — WET PREP, GENITAL
SPERM: NONE SEEN
TRICH WET PREP: NONE SEEN
YEAST WET PREP: NONE SEEN

## 2016-01-23 LAB — CBC
HEMATOCRIT: 36.4 % (ref 35.0–47.0)
Hemoglobin: 12.4 g/dL (ref 12.0–16.0)
MCH: 29.7 pg (ref 26.0–34.0)
MCHC: 34 g/dL (ref 32.0–36.0)
MCV: 87.4 fL (ref 80.0–100.0)
PLATELETS: 232 10*3/uL (ref 150–440)
RBC: 4.16 MIL/uL (ref 3.80–5.20)
RDW: 13.7 % (ref 11.5–14.5)
WBC: 7.8 10*3/uL (ref 3.6–11.0)

## 2016-01-23 LAB — URINALYSIS COMPLETE WITH MICROSCOPIC (ARMC ONLY)
Bacteria, UA: NONE SEEN
Bilirubin Urine: NEGATIVE
GLUCOSE, UA: NEGATIVE mg/dL
KETONES UR: NEGATIVE mg/dL
Leukocytes, UA: NEGATIVE
NITRITE: NEGATIVE
Protein, ur: NEGATIVE mg/dL
SPECIFIC GRAVITY, URINE: 1.02 (ref 1.005–1.030)
pH: 6 (ref 5.0–8.0)

## 2016-01-23 LAB — CHLAMYDIA/NGC RT PCR (ARMC ONLY)
Chlamydia Tr: NOT DETECTED
N gonorrhoeae: NOT DETECTED

## 2016-01-23 LAB — HCG, QUANTITATIVE, PREGNANCY: hCG, Beta Chain, Quant, S: 14534 m[IU]/mL — ABNORMAL HIGH (ref <5)

## 2016-01-23 LAB — ABO/RH: ABO/RH(D): O POS

## 2016-01-23 MED ORDER — METRONIDAZOLE 500 MG PO TABS: 500.0000 mg | ORAL_TABLET | Freq: Two times a day (BID) | ORAL | Status: AC

## 2016-01-23 MED ORDER — ACETAMINOPHEN 500 MG PO TABS
ORAL_TABLET | ORAL | Status: AC
  Filled 2016-01-23: qty 2

## 2016-01-23 MED ORDER — ACETAMINOPHEN 500 MG PO TABS
1000.0000 mg | ORAL_TABLET | Freq: Once | ORAL | Status: AC
  Administered 2016-01-23: 1000 mg via ORAL

## 2016-01-23 NOTE — ED Notes (Signed)
NAD noted at time of D/C. Pt denies questions or concerns. Pt ambulatory to the lobby at this time.  

## 2016-01-23 NOTE — Discharge Instructions (Signed)
You have been seen for lower abdominal cramping and vaginal bleeding. This is what we would consider a threatened miscarriage. At this time your ultrasound is consistent with a 5 week 4 day pregnancy. It is not clear at this time whether you are having a miscarriage or a very early pregnancy. Your pregnancy hormone level today is 14,000. Please follow-up with the health department in 2-3 days for recheck of her blood work. Return to the emergency department for any worsening pain, or any other symptom personally concerning to yourself.   Bacterial Vaginosis Bacterial vaginosis is a vaginal infection that occurs when the normal balance of bacteria in the vagina is disrupted. It results from an overgrowth of certain bacteria. This is the most common vaginal infection in women of childbearing age. Treatment is important to prevent complications, especially in pregnant women, as it can cause a premature delivery. CAUSES  Bacterial vaginosis is caused by an increase in harmful bacteria that are normally present in smaller amounts in the vagina. Several different kinds of bacteria can cause bacterial vaginosis. However, the reason that the condition develops is not fully understood. RISK FACTORS Certain activities or behaviors can put you at an increased risk of developing bacterial vaginosis, including:  Having a new sex partner or multiple sex partners.  Douching.  Using an intrauterine device (IUD) for contraception. Women do not get bacterial vaginosis from toilet seats, bedding, swimming pools, or contact with objects around them. SIGNS AND SYMPTOMS  Some women with bacterial vaginosis have no signs or symptoms. Common symptoms include:  Grey vaginal discharge.  A fishlike odor with discharge, especially after sexual intercourse.  Itching or burning of the vagina and vulva.  Burning or pain with urination. DIAGNOSIS  Your health care provider will take a medical history and examine the  vagina for signs of bacterial vaginosis. A sample of vaginal fluid may be taken. Your health care provider will look at this sample under a microscope to check for bacteria and abnormal cells. A vaginal pH test may also be done.  TREATMENT  Bacterial vaginosis may be treated with antibiotic medicines. These may be given in the form of a pill or a vaginal cream. A second round of antibiotics may be prescribed if the condition comes back after treatment. Because bacterial vaginosis increases your risk for sexually transmitted diseases, getting treated can help reduce your risk for chlamydia, gonorrhea, HIV, and herpes. HOME CARE INSTRUCTIONS   Only take over-the-counter or prescription medicines as directed by your health care provider.  If antibiotic medicine was prescribed, take it as directed. Make sure you finish it even if you start to feel better.  Tell all sexual partners that you have a vaginal infection. They should see their health care provider and be treated if they have problems, such as a mild rash or itching.  During treatment, it is important that you follow these instructions:  Avoid sexual activity or use condoms correctly.  Do not douche.  Avoid alcohol as directed by your health care provider.  Avoid breastfeeding as directed by your health care provider. SEEK MEDICAL CARE IF:   Your symptoms are not improving after 3 days of treatment.  You have increased discharge or pain.  You have a fever. MAKE SURE YOU:   Understand these instructions.  Will watch your condition.  Will get help right away if you are not doing well or get worse. FOR MORE INFORMATION  Centers for Disease Control and Prevention, Division of STD Prevention: SolutionApps.co.zawww.cdc.gov/std  American Sexual Health Association (ASHA): www.ashastd.org    This information is not intended to replace advice given to you by your health care provider. Make sure you discuss any questions you have with your health care  provider.   Document Released: 10/16/2005 Document Revised: 11/06/2014 Document Reviewed: 05/28/2013 Elsevier Interactive Patient Education 2016 [employer].  Threatened Miscarriage A threatened miscarriage is when you have vaginal bleeding during your first 20 weeks of pregnancy but the pregnancy has not ended. Your doctor will do tests to make sure you are still pregnant. The cause of the bleeding may not be known. This condition does not mean your pregnancy will end. It does increase the risk of it ending (complete miscarriage). HOME CARE   Make sure you keep all your doctor visits for prenatal care.  Get plenty of rest.  Do not have sex or use tampons if you have vaginal bleeding.  Do not douche.  Do not smoke or use drugs.  Do not drink alcohol.  Avoid caffeine. GET HELP IF:  You have light bleeding from your vagina.  You have belly pain or cramping.  You have a fever. GET HELP RIGHT AWAY IF:   You have heavy bleeding from your vagina.  You have clots of blood coming from your vagina.  You have bad pain or cramps in your low back or belly.  You have fever, chills, and bad belly pain. MAKE SURE YOU:   Understand these instructions.  Will watch your condition.  Will get help right away if you are not doing well or get worse.   This information is not intended to replace advice given to you by your health care provider. Make sure you discuss any questions you have with your health care provider.   Document Released: 09/28/2008 Document Revised: 10/21/2013 Document Reviewed: 08/12/2013 Elsevier Interactive Patient Education [employer]

## 2016-01-23 NOTE — ED Notes (Addendum)
Pt was seen here about 1 week ago and found out she was pregnant.  Today pt saw 2 blood clots in toilet when used bathroom.  She then took shower and denies any further bleeding but c/o lower mid abdominal cramping.  Denies further vaginal bleeding.  Pt reports health department told her she is about 4-5 weeks.

## 2016-01-23 NOTE — ED Provider Notes (Signed)
[facility] Emergency Department Provider Note  Time seen: 10:41 PM  I have reviewed the triage vital signs and the nursing notes.   HISTORY  Chief Complaint Vaginal Bleeding    HPI Miranda Fields is a 25 y.o. female was approximate [redacted] weeks pregnant presents the emergency department with blood she saw on the toilet, and lower abdominal cramping. Patient states she urinated this morning and consult to blood clots in the toilet. Zyrtec with lower abdominal cramping. Describes her cramping is mild to moderate. Later in the day she urinated once again denies any further bleeding. Patient continued to have moderate lower abdominal cramping so she came to the emergency department for evaluation. Denies nausea, vomiting, diarrhea, dysuria, hematuria.     History reviewed. No pertinent past medical history.  There are no active problems to display for this patient.   Past Surgical History  Procedure Laterality Date  . Ankle surgery Right     Current Outpatient Rx  Name  Route  Sig  Dispense  Refill  . azithromycin (ZITHROMAX Z-PAK) 250 MG tablet      Take 2 tablets (500 mg) on  Day 1,  followed by 1 tablet (250 mg) once daily on Days 2 through 5.   6 each   0   . ibuprofen (ADVIL,MOTRIN) 800 MG tablet   Oral   Take 1 tablet (800 mg total) by mouth every 8 (eight) hours as needed.   30 tablet   0   . LORazepam (ATIVAN) 0.5 MG tablet   Oral   Take 1 tablet (0.5 mg total) by mouth every 8 (eight) hours as needed for anxiety.   20 tablet   0   . traMADol (ULTRAM) 50 MG tablet   Oral   Take 1 tablet (50 mg total) by mouth every 6 (six) hours as needed.   20 tablet   0     Allergies Review of patient's allergies indicates no known allergies.  History reviewed. No pertinent family history.  Social History Social History  Substance Use Topics  . Smoking status: Never Smoker   . Smokeless tobacco: Never Used  . Alcohol Use: Yes     Comment:  social     Review of Systems Constitutional: Negative for fever. Cardiovascular: Negative for chest pain. Respiratory: Negative for shortness of breath. Gastrointestinal: Positive for lower abdominal cramping Genitourinary: Negative for dysuria. Positive for vaginal bleeding Musculoskeletal: Negative for back pain. Skin: Negative for rash. Neurological: Negative for headache 10-point ROS otherwise negative.  ____________________________________________   PHYSICAL EXAM:  VITAL SIGNS: ED Triage Vitals  Enc Vitals Group     BP 01/23/16 1905 150/96 mmHg     Pulse Rate 01/23/16 1905 109     Resp 01/23/16 1905 20     Temp 01/23/16 1905 98.8 F (37.1 C)     Temp Source 01/23/16 1905 Oral     SpO2 01/23/16 1905 100 %     Weight 01/23/16 1905 230 lb (104.327 kg)     Height 01/23/16 1905  (1.448 m)     Head Cir --      Peak Flow --      Pain Score 01/23/16 1905 9     Pain Loc --      Pain Edu? --      Excl. in GC? --     Constitutional: Alert and oriented. Well appearing and in no distress. Eyes: Normal exam ENT   Head: Normocephalic and atraumatic.  Mouth/Throat: Mucous membranes are moist. Cardiovascular: Normal rate, regular rhythm. No murmur Respiratory: Normal respiratory effort without tachypnea nor retractions. Breath sounds are clear  Gastrointestinal: Soft and nontender. No distention.   Musculoskeletal: Nontender with normal range of motion in all extremities. Neurologic:  Normal speech and language. No gross focal neurologic deficits Skin:  Skin is warm, dry and intact.  Psychiatric: Mood and affect are normal. Speech and behavior are normal  ____________________________________________     RADIOLOGY  Ultrasound consistent with 5 week 4 day gestational sac.  INITIAL IMPRESSION / ASSESSMENT AND PLAN / ED COURSE  Pertinent labs & imaging results that were available during my care of the patient were reviewed by me and considered in my medical  decision making (see chart for details).  Patient presents with lower abdominal cramping and mild vaginal bleeding. On pelvic exam patient has mild vaginal bleeding, moderate discharge. Patient is also consistent with 5 week 4 day gestational sac. Wet prep consistent with bacterial vaginitis. Discussed with the patient taking Tylenol as needed for discomfort, drinking plenty of fluids. We'll discharge patient home with OB/GYN follow-up. Patient agreeable to plan.  ____________________________________________   FINAL CLINICAL IMPRESSION(S) / ED DIAGNOSES  Threatened miscarriage Bacterial vaginitis   Minna AntisKevin Deboraha Goar, MD 01/23/16 2244

## 2016-10-26 ENCOUNTER — Emergency Department
Admission: EM | Admit: 2016-10-26 | Discharge: 2016-10-27 | Disposition: A | Discharge status: Home / Self Care | Payer: Medicaid Other | Attending: Emergency Medicine | Admitting: Emergency Medicine

## 2016-10-26 ENCOUNTER — Encounter: Payer: Self-pay | Admitting: Emergency Medicine

## 2016-10-26 DIAGNOSIS — Z791 Long term (current) use of non-steroidal anti-inflammatories (NSAID): Secondary | ICD-10-CM | POA: Diagnosis not present

## 2016-10-26 DIAGNOSIS — Z79899 Other long term (current) drug therapy: Secondary | ICD-10-CM | POA: Diagnosis not present

## 2016-10-26 DIAGNOSIS — R102 Pelvic and perineal pain: Secondary | ICD-10-CM | POA: Diagnosis present

## 2016-10-26 DIAGNOSIS — A5901 Trichomonal vulvovaginitis: Secondary | ICD-10-CM | POA: Diagnosis not present

## 2016-10-26 DIAGNOSIS — R1030 Lower abdominal pain, unspecified: Secondary | ICD-10-CM

## 2016-10-26 LAB — CBC WITH DIFFERENTIAL/PLATELET
Basophils Absolute: 0 10*3/uL (ref 0–0.1)
Basophils Relative: 0 %
Eosinophils Absolute: 0.1 10*3/uL (ref 0–0.7)
Eosinophils Relative: 2 %
HCT: 36.5 % (ref 35.0–47.0)
Hemoglobin: 12.6 g/dL (ref 12.0–16.0)
Lymphocytes Relative: 28 %
Lymphs Abs: 2.7 10*3/uL (ref 1.0–3.6)
MCH: 29.4 pg (ref 26.0–34.0)
MCHC: 34.6 g/dL (ref 32.0–36.0)
MCV: 85.1 fL (ref 80.0–100.0)
Monocytes Absolute: 0.5 10*3/uL (ref 0.2–0.9)
Monocytes Relative: 6 %
Neutro Abs: 6.4 10*3/uL (ref 1.4–6.5)
Neutrophils Relative %: 64 %
Platelets: 242 10*3/uL (ref 150–440)
RBC: 4.29 MIL/uL (ref 3.80–5.20)
RDW: 13.6 % (ref 11.5–14.5)
WBC: 9.9 10*3/uL (ref 3.6–11.0)

## 2016-10-26 LAB — URINALYSIS, COMPLETE (UACMP) WITH MICROSCOPIC
Bilirubin Urine: NEGATIVE
Glucose, UA: NEGATIVE mg/dL
Ketones, ur: NEGATIVE mg/dL
NITRITE: NEGATIVE
PROTEIN: NEGATIVE mg/dL
Specific Gravity, Urine: 1.024 (ref 1.005–1.030)
pH: 6 (ref 5.0–8.0)

## 2016-10-26 LAB — BASIC METABOLIC PANEL
Anion gap: 4 — ABNORMAL LOW (ref 5–15)
BUN: 24 mg/dL — ABNORMAL HIGH (ref 6–20)
CO2: 28 mmol/L (ref 22–32)
Calcium: 8.7 mg/dL — ABNORMAL LOW (ref 8.9–10.3)
Chloride: 105 mmol/L (ref 101–111)
Creatinine, Ser: 1.02 mg/dL — ABNORMAL HIGH (ref 0.44–1.00)
GFR calc Af Amer: >60 mL/min (ref >60)
GFR calc non Af Amer: >60 mL/min (ref >60)
Glucose, Bld: 96 mg/dL (ref 65–99)
Potassium: 4 mmol/L (ref 3.5–5.1)
Sodium: 137 mmol/L (ref 135–145)

## 2016-10-26 NOTE — ED Triage Notes (Signed)
Pt presents to with yellow foul smelling vaginal discharge for over a week and intermittent pelvic pain for several weeks. Pt states yesterday she noticed a "bump" on her left labia that is very painful to touch. Pt reports she has noticed a similar area previously but it had resolved on its own.

## 2016-10-27 ENCOUNTER — Emergency Department: Payer: Medicaid Other

## 2016-10-27 LAB — WET PREP, GENITAL
Clue Cells Wet Prep HPF POC: NONE SEEN
SPERM: NONE SEEN
YEAST WET PREP: NONE SEEN

## 2016-10-27 LAB — CHLAMYDIA/NGC RT PCR (ARMC ONLY)
Chlamydia Tr: NOT DETECTED
N GONORRHOEAE: NOT DETECTED

## 2016-10-27 LAB — PREGNANCY, URINE: PREG TEST UR: NEGATIVE

## 2016-10-27 MED ORDER — KETOROLAC TROMETHAMINE 30 MG/ML IJ SOLN
30.0000 mg | Freq: Once | INTRAMUSCULAR | Status: AC
  Administered 2016-10-27: 30 mg via INTRAMUSCULAR

## 2016-10-27 MED ORDER — METRONIDAZOLE 250 MG PO TABS: 250.0000 mg | ORAL_TABLET | Freq: Three times a day (TID) | ORAL | 0 refills | Status: AC

## 2016-10-27 MED ORDER — METRONIDAZOLE 500 MG PO TABS
500.0000 mg | ORAL_TABLET | Freq: Once | ORAL | Status: AC
  Administered 2016-10-27: 500 mg via ORAL
  Filled 2016-10-27: qty 1

## 2016-10-27 MED ORDER — HYDROCHLOROTHIAZIDE 25 MG PO TABS
ORAL_TABLET | ORAL | Status: AC
  Filled 2016-10-27: qty 1

## 2016-10-27 MED ORDER — KETOROLAC TROMETHAMINE 30 MG/ML IJ SOLN
30.0000 mg | Freq: Once | INTRAMUSCULAR | Status: DC
  Filled 2016-10-27: qty 1

## 2016-10-27 NOTE — Discharge Instructions (Signed)
Take antibiotics fully as prescribed. Do not drink alcohol with the antibiotics as it may cause an allergic reaction. All your sex partners need to be treated as well to prevent you from getting reinfected. Follow-up with her doctor in 1 week.

## 2016-10-27 NOTE — ED Notes (Signed)
Pelvic cart at bedside. 

## 2016-10-27 NOTE — ED Notes (Signed)
Pt states she feels she has an abscess on left labia, and is c/o 9/10 pain that is persistent.  She also says she has burning pain during urination and denies any fevers.  [facility] and was told she was "ok".   She also said the foul odor she has smelled is intermittent in nature.

## 2016-10-27 NOTE — ED Notes (Signed)
ED Provider at bedside. 

## 2016-10-27 NOTE — ED Notes (Signed)
Called lab to check on status of u-preg for this patient, they said they "would get to it shortly"

## 2016-10-27 NOTE — ED Provider Notes (Signed)
[facility] Emergency Department Provider Note  ____________________________________________  Time seen: Approximately 12:21 AM  I have reviewed the triage vital signs and the nursing notes.   HISTORY  Chief Complaint Pelvic Pain and Vaginal Discharge   HPI Miranda Fields is a 25 y.o. female with no significant past medical history who presents for evaluation of vaginal discharge and pelvic pain. Patient reports 10 days of the foul-smelling vaginal discharge, vaginal discomfort/burning, and lower abdominal pain radiating to her back. She reports her pain is crampy and sharp, located in the lower abdomen, radiating to her lower back,  9/10, constant and progressively worse over the course of the last 10 days. She is sexually active and uses condoms intermittently. She denies any prior history of STDs. She does not take any birth control. She hasn't tried anything at home for the pain.No prior abdominal surgeries.  History reviewed. No pertinent past medical history.  There are no active problems to display for this patient.   Past Surgical History:  Procedure Laterality Date  . ANKLE SURGERY Right     Prior to Admission medications   Medication Sig Start Date End Date Taking? Authorizing Provider  azithromycin (ZITHROMAX Z-PAK) 250 MG tablet Take 2 tablets (500 mg) on  Day 1,  followed by 1 tablet (250 mg) once daily on Days 2 through 5. 04/09/15   Evangeline Dakinharles M Beers, PA-C  ibuprofen (ADVIL,MOTRIN) 800 MG tablet Take 1 tablet (800 mg total) by mouth every 8 (eight) hours as needed. 11/14/15   Emily FilbertJonathan E Williams, MD  LORazepam (ATIVAN) 0.5 MG tablet Take 1 tablet (0.5 mg total) by mouth every 8 (eight) hours as needed for anxiety. 11/14/15 11/13/16  Emily FilbertJonathan E Williams, MD  metroNIDAZOLE (FLAGYL) 250 MG tablet Take 1 tablet (250 mg total) by mouth 3 (three) times daily. 10/27/16 11/03/16  Nita Sicklearolina Porcha Deblanc, MD    Allergies Patient has no known  allergies.  History reviewed. No pertinent family history.  Social History Social History  Substance Use Topics  . Smoking status: Never Smoker  . Smokeless tobacco: Never Used  . Alcohol use Yes     Comment: social     Review of Systems Constitutional: Negative for fever. Eyes: Negative for visual changes. ENT: Negative for sore throat. Neck: No neck pain  Cardiovascular: Negative for chest pain. Respiratory: Negative for shortness of breath. Gastrointestinal: + lower abdominal pain. No vomiting or diarrhea. Genitourinary: Negative for dysuria. + vaginal discharge and pain Musculoskeletal: Negative for back pain. Skin: Negative for rash. Neurological: Negative for headaches, weakness or numbness. Psych: No SI or HI  ____________________________________________   PHYSICAL EXAM:  VITAL SIGNS: ED Triage Vitals [10/26/16 2226]  Enc Vitals Group     BP (!) 126/106     Pulse Rate 76     Resp 20     Temp 98.7 F (37.1 C)     Temp Source Oral     SpO2 99 %     Weight 230 lb (104.3 kg)     Height 5\' 1"  (1.549 m)     Head Circumference      Peak Flow      Pain Score 9     Pain Loc      Pain Edu?      Excl. in GC?     Constitutional: Alert and oriented. Well appearing and in no apparent distress. HEENT:      Head: Normocephalic and atraumatic.  Eyes: Conjunctivae are normal. Sclera is non-icteric. EOMI. PERRL      Mouth/Throat: Mucous membranes are moist.       Neck: Supple with no signs of meningismus. Cardiovascular: Regular rate and rhythm. No murmurs, gallops, or rubs. 2+ symmetrical distal pulses are present in all extremities. No JVD. Respiratory: Normal respiratory effort. Lungs are clear to auscultation bilaterally. No wheezes, crackles, or rhonchi.  Gastrointestinal: Soft, ttp over the lower quadrants, non distended with positive bowel sounds. No rebound or guarding. Genitourinary: No CVA tenderness. Pelvic exam: Normal external genitalia, no rashes  or lesions. Watery, yellow frothy discharge. Os closed. No cervical motion tenderness.  Patient has uterine and b/l adnexal tenderness.  Musculoskeletal: Nontender with normal range of motion in all extremities. No edema, cyanosis, or erythema of extremities. Neurologic: Normal speech and language. Face is symmetric. Moving all extremities. No gross focal neurologic deficits are appreciated. Skin: Skin is warm, dry and intact. No rash noted. Psychiatric: Mood and affect are normal. Speech and behavior are normal.  ____________________________________________   LABS (all labs ordered are listed, but only abnormal results are displayed)  Labs Reviewed  WET PREP, GENITAL - Abnormal; Notable for the following:       Result Value   Trich, Wet Prep PRESENT (*)    WBC, Wet Prep HPF POC MANY (*)    All other components within normal limits  BASIC METABOLIC PANEL - Abnormal; Notable for the following:    BUN 24 (*)    Creatinine, Ser 1.02 (*)    Calcium 8.7 (*)    Anion gap 4 (*)    All other components within normal limits  URINALYSIS, COMPLETE (UACMP) WITH MICROSCOPIC - Abnormal; Notable for the following:    Color, Urine YELLOW (*)    APPearance CLEAR (*)    Hgb urine dipstick SMALL (*)    Leukocytes, UA LARGE (*)    Bacteria, UA RARE (*)    Squamous Epithelial / LPF 0-5 (*)    Crystals PRESENT (*)    All other components within normal limits  CHLAMYDIA/NGC RT PCR ([facility] ONLY)  CBC WITH DIFFERENTIAL/PLATELET  PREGNANCY, URINE   ____________________________________________  EKG  none ____________________________________________  RADIOLOGY  Pelvic US: 1. Questionable ill-defined anterior uterine fibroid. 2. Normal appearance of both ovaries. No adnexal mass. ____________________________________________   PROCEDURES  Procedure(s) performed: None Procedures Critical Care performed:  None ____________________________________________   INITIAL IMPRESSION /  ASSESSMENT AND PLAN / ED COURSE  25 y.o. female with no significant past medical history who presents for evaluation of vaginal discharge, associated with pelvic pain, lower abdominal pain radiating to her back x 10 days. A pelvic exam patient has a yellow frothy discharge with tenderness to palpation over her uterus and bilateral adnexa. We'll order transvaginal ultrasound to rule out a tubo-ovarian abscess. Wet prep, gonorrhea and chlamydia have been sent.  Clinical Course as of Oct 27 217  Fri Oct 27, 2016  0214 Vaginal swabs positive for Trichomonas. Transvaginal ultrasound with no evidence of tubo-ovarian abscess. Patient was started on Flagyl and is going to be discharged home on this medication. Recommended close follow-up with PCP. I discussed safe sex instructions with patient and recommended that her partner gets treated as well to prevent reinfection.  [CV]    Clinical Course User Index [CV] Nita Sickle, MD    Pertinent labs & imaging results that were available during my care of the patient were reviewed by me and considered in my medical decision making (see chart  for details).    ____________________________________________   FINAL CLINICAL IMPRESSION(S) / ED DIAGNOSES  Final diagnoses:  Lower abdominal pain  Trichomonas vaginitis      NEW MEDICATIONS STARTED DURING THIS VISIT:  New Prescriptions   METRONIDAZOLE (FLAGYL) 250 MG TABLET    Take 1 tablet (250 mg total) by mouth 3 (three) times daily.     Note:  This document was prepared using Dragon voice recognition software and may include unintentional dictation errors.    Nita Sicklearolina Jashaun Penrose, MD 10/27/16 [phone]

## 2016-10-27 NOTE — ED Notes (Signed)
US called to check status of u-preg, I told them I had already contacted lab about it.

## 2016-11-03 ENCOUNTER — Emergency Department
Admission: EM | Admit: 2016-11-03 | Discharge: 2016-11-03 | Disposition: A | Discharge status: Other Acute Inpatient Hospital | Payer: Medicaid Other

## 2016-11-04 ENCOUNTER — Emergency Department
Admission: EM | Admit: 2016-11-04 | Discharge: 2016-11-04 | Disposition: A | Discharge status: Home / Self Care | Payer: Medicaid Other | Attending: Emergency Medicine | Admitting: Emergency Medicine

## 2016-11-04 ENCOUNTER — Emergency Department: Payer: Medicaid Other

## 2016-11-04 ENCOUNTER — Encounter: Payer: Self-pay | Admitting: Emergency Medicine

## 2016-11-04 DIAGNOSIS — R509 Fever, unspecified: Secondary | ICD-10-CM | POA: Diagnosis present

## 2016-11-04 DIAGNOSIS — J209 Acute bronchitis, unspecified: Secondary | ICD-10-CM | POA: Diagnosis not present

## 2016-11-04 DIAGNOSIS — B9789 Other viral agents as the cause of diseases classified elsewhere: Secondary | ICD-10-CM

## 2016-11-04 DIAGNOSIS — N390 Urinary tract infection, site not specified: Secondary | ICD-10-CM | POA: Diagnosis not present

## 2016-11-04 DIAGNOSIS — J069 Acute upper respiratory infection, unspecified: Secondary | ICD-10-CM | POA: Insufficient documentation

## 2016-11-04 DIAGNOSIS — R319 Hematuria, unspecified: Secondary | ICD-10-CM

## 2016-11-04 LAB — COMPREHENSIVE METABOLIC PANEL
ALT: 27 U/L (ref 14–54)
ANION GAP: 8 (ref 5–15)
AST: 33 U/L (ref 15–41)
Albumin: 3.9 g/dL (ref 3.5–5.0)
Alkaline Phosphatase: 65 U/L (ref 38–126)
BILIRUBIN TOTAL: 0.3 mg/dL (ref 0.3–1.2)
BUN: 9 mg/dL (ref 6–20)
CHLORIDE: 104 mmol/L (ref 101–111)
CO2: 25 mmol/L (ref 22–32)
Calcium: 8.6 mg/dL — ABNORMAL LOW (ref 8.9–10.3)
Creatinine, Ser: 0.92 mg/dL (ref 0.44–1.00)
Glucose, Bld: 120 mg/dL — ABNORMAL HIGH (ref 65–99)
POTASSIUM: 3.6 mmol/L (ref 3.5–5.1)
Sodium: 137 mmol/L (ref 135–145)
TOTAL PROTEIN: 7.6 g/dL (ref 6.5–8.1)

## 2016-11-04 LAB — CBC
HEMATOCRIT: 39.2 % (ref 35.0–47.0)
Hemoglobin: 13.2 g/dL (ref 12.0–16.0)
MCH: 28.9 pg (ref 26.0–34.0)
MCHC: 33.7 g/dL (ref 32.0–36.0)
MCV: 85.8 fL (ref 80.0–100.0)
Platelets: 214 10*3/uL (ref 150–440)
RBC: 4.56 MIL/uL (ref 3.80–5.20)
RDW: 13.9 % (ref 11.5–14.5)
WBC: 4.8 10*3/uL (ref 3.6–11.0)

## 2016-11-04 LAB — URINALYSIS, COMPLETE (UACMP) WITH MICROSCOPIC
BILIRUBIN URINE: NEGATIVE
GLUCOSE, UA: NEGATIVE mg/dL
Ketones, ur: NEGATIVE mg/dL
NITRITE: NEGATIVE
PROTEIN: NEGATIVE mg/dL
Specific Gravity, Urine: 1.026 (ref 1.005–1.030)
pH: 5 (ref 5.0–8.0)

## 2016-11-04 LAB — POCT PREGNANCY, URINE: Preg Test, Ur: NEGATIVE

## 2016-11-04 LAB — POCT RAPID STREP A: Streptococcus, Group A Screen (Direct): NEGATIVE

## 2016-11-04 LAB — LIPASE, BLOOD: LIPASE: 20 U/L (ref 11–51)

## 2016-11-04 LAB — RAPID INFLUENZA A&B ANTIGENS (ARMC ONLY): INFLUENZA B (ARMC): NEGATIVE

## 2016-11-04 LAB — RAPID INFLUENZA A&B ANTIGENS: Influenza A (ARMC): NEGATIVE

## 2016-11-04 MED ORDER — ACETAMINOPHEN 325 MG PO TABS
650.0000 mg | ORAL_TABLET | Freq: Once | ORAL | Status: AC | PRN
  Administered 2016-11-04: 650 mg via ORAL

## 2016-11-04 MED ORDER — ACETAMINOPHEN 325 MG PO TABS
ORAL_TABLET | ORAL | Status: AC
  Filled 2016-11-04: qty 2

## 2016-11-04 MED ORDER — IPRATROPIUM-ALBUTEROL 0.5-2.5 (3) MG/3ML IN SOLN
3.0000 mL | Freq: Once | RESPIRATORY_TRACT | Status: AC
  Administered 2016-11-04: 3 mL via RESPIRATORY_TRACT
  Filled 2016-11-04: qty 3

## 2016-11-04 MED ORDER — ALBUTEROL SULFATE HFA 108 (90 BASE) MCG/ACT IN AERS: 2.0000 | INHALATION_SPRAY | Freq: Four times a day (QID) | RESPIRATORY_TRACT | 0 refills | Status: DC | PRN

## 2016-11-04 MED ORDER — CEPHALEXIN 500 MG PO CAPS: 500.0000 mg | ORAL_CAPSULE | Freq: Two times a day (BID) | ORAL | 0 refills | Status: DC

## 2016-11-04 NOTE — Discharge Instructions (Signed)
Evaluated for fever and upper respiratory symptoms and your exam and evaluation are reassuring in the emergency department today. Your being treated with Keflex for likely urinary tract infection. You're being treated with inhaler for wheezing also called bronco spasm for what I suspect is viral upper respiratory illness causing bronchitis or wheezing.  Return to the emergency room for any worsening symptoms including trouble breathing, trouble swallowing, abdominal pain, vomiting and cannot keep medications down, or concern for dehydration such as dry mouth and not making urine. Return for any other symptoms that are concerning to you.

## 2016-11-04 NOTE — ED Notes (Signed)
Rapid influenza swab collected and sent to lab for processing.

## 2016-11-04 NOTE — ED Notes (Signed)
Patient transported to X-ray 

## 2016-11-04 NOTE — ED Provider Notes (Signed)
[facility] Emergency Department Provider Note ____________________________________________   I have reviewed the triage vital signs and the triage nursing note.  HISTORY  Chief Complaint Emesis and Fever   Historian Patient  HPI Miranda Fields is a 26 y.o. female presenting with fever and cough and sore throat for several days associated with muscle and body aches. Mild nausea without vomiting or diarrhea. No abdominal pain. No chest pain. Mild headache without confusion or altered mental status. No focal weakness or numbness.  States that she went to an urgent care yesterday and was told that she had strep throat but was not prescribed antibiotics.  No significant trouble swallowing. No trouble breathing. She feels soreness in the center of her chest when she coughs, but no pleuritic chest pain. No shortness of breath. She does report coughing.  Symptoms are moderate. Nothing makes it worse or better. She has been taking Tylenol and Motrin.    History reviewed. No pertinent past medical history.  There are no active problems to display for this patient.   Past Surgical History:  Procedure Laterality Date  . ANKLE SURGERY Right     Prior to Admission medications   Medication Sig Start Date End Date Taking? Authorizing Provider  guaiFENesin (MUCINEX) 600 MG 12 hr tablet Take 600 mg by mouth 2 (two) times daily as needed.   Yes Historical Provider, MD  albuterol (PROVENTIL HFA;VENTOLIN HFA) 108 (90 Base) MCG/ACT inhaler Inhale 2 puffs into the lungs every 6 (six) hours as needed for wheezing or shortness of breath. 11/04/16   Governor Rooksebecca Sendy Pluta, MD  azithromycin (ZITHROMAX Z-PAK) 250 MG tablet Take 2 tablets (500 mg) on  Day 1,  followed by 1 tablet (250 mg) once daily on Days 2 through 5. Patient not taking: Reported on 11/04/2016 04/09/15   Charmayne Sheerharles M Beers, PA-C  cephALEXin (KEFLEX) 500 MG capsule Take 1 capsule (500 mg total) by mouth 2 (two) times daily.  11/04/16   Governor Rooksebecca Danyle Boening, MD  ibuprofen (ADVIL,MOTRIN) 800 MG tablet Take 1 tablet (800 mg total) by mouth every 8 (eight) hours as needed. Patient not taking: Reported on 11/04/2016 11/14/15   Emily FilbertJonathan E Williams, MD  LORazepam (ATIVAN) 0.5 MG tablet Take 1 tablet (0.5 mg total) by mouth every 8 (eight) hours as needed for anxiety. Patient not taking: Reported on 11/04/2016 11/14/15 11/13/16  Emily FilbertJonathan E Williams, MD    No Known Allergies  History reviewed. No pertinent family history.  Social History Social History  Substance Use Topics  . Smoking status: Never Smoker  . Smokeless tobacco: Never Used  . Alcohol use Yes     Comment: social     Review of Systems  Constitutional: Positive for fever. Eyes: Negative for visual changes. ENT: positive for mild sore throat. Cardiovascular: central chest pain and soreness from coughing without pleuritic chest pain. Respiratory: some moderate cough and mild shortness of breath with coughing Gastrointestinal: Negative for abdominal pain, vomiting and diarrhea. Genitourinary: Negative for dysuria. Musculoskeletal: Negative for back pain. Skin: Negative for rash. Neurological: Negative for headache. 10 point Review of Systems otherwise negative ____________________________________________   PHYSICAL EXAM:  VITAL SIGNS: ED Triage Vitals  Enc Vitals Group     BP 11/04/16 1920 108/68     Pulse Rate 11/04/16 1412 (!) 113     Resp 11/04/16 1412 20     Temp 11/04/16 1412 (!) 103 F (39.4 C)     Temp Source 11/04/16 1412 Oral     SpO2 11/04/16  1412 99 %     Weight 11/04/16 1413 230 lb (104.3 kg)     Height 11/04/16 1413 5\' 1"  (1.549 m)     Head Circumference --      Peak Flow --      Pain Score 11/04/16 1413 9     Pain Loc --      Pain Edu? --      Excl. in GC? --      Constitutional: Alert and oriented. Well appearing and in no distress. HEENT   Head: Normocephalic and atraumatic.      Eyes: Conjunctivae are normal. PERRL. Normal  extraocular movements.      Ears:         Nose: No congestion/rhinnorhea.   Mouth/Throat: Mucous membranes are moist.  Pharyngeal erythema without tonsillar exudates.   Neck: No stridor. Cardiovascular/Chest:tachycardic, regular rhythm.  No murmurs, rubs, or gallops. Respiratory: Normal respiratory effort without tachypnea nor retractions. Breath sounds are clear and equal bilaterally. No wheezes/rales/rhonchi, however somewhat tight breath sounds. Gastrointestinal: Soft. No distention, no guarding, no rebound. Nontender.  None  Genitourinary/rectal:Deferred Musculoskeletal: Nontender with normal range of motion in all extremities. No joint effusions.  No lower extremity tenderness.  No edema. Neurologic:  Normal speech and language. No gross or focal neurologic deficits are appreciated. Skin:  Skin is warm, dry and intact. No rash noted. Psychiatric: Mood and affect are normal. Speech and behavior are normal. Patient exhibits appropriate insight and judgment.   ____________________________________________  LABS (pertinent positives/negatives)  Labs Reviewed  COMPREHENSIVE METABOLIC PANEL - Abnormal; Notable for the following:       Result Value   Glucose, Bld 120 (*)    Calcium 8.6 (*)    All other components within normal limits  URINALYSIS, COMPLETE (UACMP) WITH MICROSCOPIC - Abnormal; Notable for the following:    Color, Urine YELLOW (*)    APPearance CLEAR (*)    Hgb urine dipstick LARGE (*)    Leukocytes, UA SMALL (*)    Bacteria, UA RARE (*)    Squamous Epithelial / LPF 0-5 (*)    All other components within normal limits  RAPID INFLUENZA A&B ANTIGENS ([facility] ONLY)  URINE CULTURE  LIPASE, BLOOD  CBC  POC URINE PREG, ED  POCT PREGNANCY, URINE  POCT RAPID STREP A    ____________________________________________    EKG I, Governor Rooks, MD, the attending physician have personally viewed and interpreted all ECGs.  89 bpm. Normal sinus rhythm. Narrow QRS. Normal  axis. Nonspecific T-wave ____________________________________________  RADIOLOGY All Xrays were viewed by me. Imaging interpreted by Radiologist.  Chest x-ray two-view:  IMPRESSION: 1. Mild diffuse peribronchial cuffing suggestive of an acute bronchitis. __________________________________________  PROCEDURES  Procedure(s) performed: None  Critical Care performed: None  ____________________________________________   ED COURSE / ASSESSMENT AND PLAN  Pertinent labs & imaging results that were available during my care of the patient were reviewed by me and considered in my medical decision making (see chart for details).   She is flushed and had a fever, but no hypotension. She has a mild erythema to the throat, but strep testing here is negative.  Influenza testing negative. Patient feels much improved after observation to make here with IV fluids, DuoNeb treatment.I am going to discharge her with albuterol. I'll not start h on steroids given improvement just with albuterol itself.  uspect viral upper respiratory illness with bronchospasm.  Urinalysis possibly consistent with urinary tract infection, we'll go ahead and cover with Keflex any culture will  be sent.  CONSULTATIONS:  None  Patient / Family / Caregiver informed of clinical course, medical decision-making process, and agree with plan.   I discussed return precautions, follow-up instructions, and discharge instructions with patient and/or family.   ___________________________________________   FINAL CLINICAL IMPRESSION(S) / ED DIAGNOSES   Final diagnoses:  Urinary tract infection with hematuria, site unspecified  Viral URI with cough  Bronchospasm with bronchitis, acute              Note: This dictation was prepared with Dragon dictation. Any transcriptional errors that result from this process are unintentional    Governor Rooks, MD 11/04/16 2030

## 2016-11-04 NOTE — ED Notes (Addendum)
Nurse introduced herself to pt. Pt was up and ambulated by nurse. Pt's oxygen saturation remained between 97- 100% . Pt stating that she is feeling better, but is still achy all over. Pt given some graham crackers after okaying it with Dr. Shaune PollackLord.

## 2016-11-04 NOTE — ED Triage Notes (Signed)
Pt reports vomiting since yesterday and unable to keep anything down. Also has other flu like sx such as fever, body aches, cough and congestion. Cannot get fever below 101 per pt despite motrin/tylenol. Motrin last 2 hr ago. Tylenol was last night.

## 2016-11-06 LAB — URINE CULTURE: Culture: <10000 — AB

## 2017-01-29 ENCOUNTER — Emergency Department: Payer: Self-pay

## 2017-01-29 ENCOUNTER — Emergency Department
Admission: EM | Admit: 2017-01-29 | Discharge: 2017-01-29 | Disposition: A | Discharge status: Home / Self Care | Payer: Self-pay | Attending: Emergency Medicine | Admitting: Emergency Medicine

## 2017-01-29 ENCOUNTER — Encounter: Payer: Self-pay | Admitting: *Deleted

## 2017-01-29 DIAGNOSIS — S93491A Sprain of other ligament of right ankle, initial encounter: Secondary | ICD-10-CM | POA: Insufficient documentation

## 2017-01-29 DIAGNOSIS — Y939 Activity, unspecified: Secondary | ICD-10-CM | POA: Insufficient documentation

## 2017-01-29 DIAGNOSIS — Y990 Civilian activity done for income or pay: Secondary | ICD-10-CM | POA: Insufficient documentation

## 2017-01-29 DIAGNOSIS — Y929 Unspecified place or not applicable: Secondary | ICD-10-CM | POA: Insufficient documentation

## 2017-01-29 DIAGNOSIS — W11XXXA Fall on and from ladder, initial encounter: Secondary | ICD-10-CM | POA: Insufficient documentation

## 2017-01-29 MED ORDER — MELOXICAM 7.5 MG PO TABS: 7.5000 mg | ORAL_TABLET | Freq: Every day | ORAL | 1 refills | Status: AC

## 2017-01-29 MED ORDER — NAPROXEN 500 MG PO TABS
500.0000 mg | ORAL_TABLET | Freq: Once | ORAL | Status: AC
  Administered 2017-01-29: 500 mg via ORAL
  Filled 2017-01-29: qty 1

## 2017-01-29 NOTE — ED Notes (Signed)
See triage note  States she was on a ladder at work and fell having pain to right ankle  Describes pain as burning type pain   Min swelling  Ambulates with limp to treatment room

## 2017-01-29 NOTE — ED Notes (Signed)
Pt completed w/c COC which her supervisor stated she needed a urine drug screen per LINDA, RN; gave pt her copy and employers copy and hand delivered to lab and signed in.

## 2017-01-29 NOTE — ED Triage Notes (Signed)
States right ankle pain after she fell off a ladder at work, [employer], pt was 3 steps up, ambulatory
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
## 2017-08-07 ENCOUNTER — Emergency Department: Payer: Self-pay

## 2017-08-07 ENCOUNTER — Emergency Department
Admission: EM | Admit: 2017-08-07 | Discharge: 2017-08-07 | Disposition: A | Discharge status: Home / Self Care | Payer: Self-pay | Attending: Emergency Medicine | Admitting: Emergency Medicine

## 2017-08-07 ENCOUNTER — Encounter: Payer: Self-pay | Admitting: Emergency Medicine

## 2017-08-07 DIAGNOSIS — R109 Unspecified abdominal pain: Secondary | ICD-10-CM

## 2017-08-07 DIAGNOSIS — R102 Pelvic and perineal pain: Secondary | ICD-10-CM | POA: Insufficient documentation

## 2017-08-07 LAB — CBC
HCT: 39.3 % (ref 35.0–47.0)
Hemoglobin: 13.2 g/dL (ref 12.0–16.0)
MCH: 29.4 pg (ref 26.0–34.0)
MCHC: 33.5 g/dL (ref 32.0–36.0)
MCV: 87.8 fL (ref 80.0–100.0)
PLATELETS: 229 10*3/uL (ref 150–440)
RBC: 4.48 MIL/uL (ref 3.80–5.20)
RDW: 13.7 % (ref 11.5–14.5)
WBC: 7 10*3/uL (ref 3.6–11.0)

## 2017-08-07 LAB — COMPREHENSIVE METABOLIC PANEL
ALT: 18 U/L (ref 14–54)
AST: 20 U/L (ref 15–41)
Albumin: 3.8 g/dL (ref 3.5–5.0)
Alkaline Phosphatase: 72 U/L (ref 38–126)
Anion gap: 7 (ref 5–15)
BILIRUBIN TOTAL: 0.4 mg/dL (ref 0.3–1.2)
BUN: 14 mg/dL (ref 6–20)
CALCIUM: 8.8 mg/dL — AB (ref 8.9–10.3)
CHLORIDE: 107 mmol/L (ref 101–111)
CO2: 25 mmol/L (ref 22–32)
CREATININE: 0.83 mg/dL (ref 0.44–1.00)
Glucose, Bld: 104 mg/dL — ABNORMAL HIGH (ref 65–99)
Potassium: 3.7 mmol/L (ref 3.5–5.1)
Sodium: 139 mmol/L (ref 135–145)
Total Protein: 7.4 g/dL (ref 6.5–8.1)

## 2017-08-07 LAB — URINALYSIS, COMPLETE (UACMP) WITH MICROSCOPIC
Bilirubin Urine: NEGATIVE
GLUCOSE, UA: NEGATIVE mg/dL
KETONES UR: NEGATIVE mg/dL
Nitrite: NEGATIVE
PH: 6 (ref 5.0–8.0)
PROTEIN: NEGATIVE mg/dL
Specific Gravity, Urine: 1.017 (ref 1.005–1.030)

## 2017-08-07 LAB — LIPASE, BLOOD: LIPASE: 30 U/L (ref 11–51)

## 2017-08-07 LAB — HCG, QUANTITATIVE, PREGNANCY: hCG, Beta Chain, Quant, S: <1 m[IU]/mL (ref <5)

## 2017-08-07 LAB — PREGNANCY, URINE: Preg Test, Ur: NEGATIVE

## 2017-08-07 MED ORDER — ONDANSETRON HCL 4 MG/2ML IJ SOLN
4.0000 mg | Freq: Once | INTRAMUSCULAR | Status: AC
  Administered 2017-08-07: 4 mg via INTRAVENOUS
  Filled 2017-08-07: qty 2

## 2017-08-07 MED ORDER — IOPAMIDOL (ISOVUE-300) INJECTION 61%
30.0000 mL | Freq: Once | INTRAVENOUS | Status: AC | PRN
  Administered 2017-08-07: 30 mL via ORAL

## 2017-08-07 MED ORDER — TRAMADOL HCL 50 MG PO TABS: 50.0000 mg | ORAL_TABLET | Freq: Four times a day (QID) | ORAL | 0 refills | Status: AC | PRN

## 2017-08-07 MED ORDER — MORPHINE SULFATE (PF) 4 MG/ML IV SOLN
4.0000 mg | Freq: Once | INTRAVENOUS | Status: AC
  Administered 2017-08-07: 4 mg via INTRAVENOUS
  Filled 2017-08-07: qty 1

## 2017-08-07 MED ORDER — IOPAMIDOL (ISOVUE-300) INJECTION 61%
100.0000 mL | Freq: Once | INTRAVENOUS | Status: AC | PRN
  Administered 2017-08-07: 100 mL via INTRAVENOUS

## 2017-08-07 NOTE — ED Notes (Addendum)
Waiting to go for [place].  Pt sleeping

## 2017-08-07 NOTE — ED Notes (Signed)
AAOx3  Skin warm and dry. NAD.  Ambualtes with easy and steady gait. 

## 2017-08-07 NOTE — ED Triage Notes (Signed)
Patient ambulatory to triage with steady gait, without difficulty, tearful; pt reports generalized abd pain x 2 days with no accomp symptoms; describes as "tightening"

## 2017-08-07 NOTE — ED Notes (Signed)
Patient transported to CT 

## 2017-08-07 NOTE — ED Notes (Signed)
Pt in [place]

## 2017-08-07 NOTE — ED Provider Notes (Signed)
[facility] Emergency Department Provider Note   ____________________________________________   First MD Initiated Contact with Patient 08/07/17 [phone]     (approximate)  I have reviewed the triage vital signs and the nursing notes.   HISTORY  Chief Complaint Abdominal Pain   HPI Miranda Fields is a 26 y.o. female Patient complains of worsening belly pain for 2 days no nausea vomiting diarrhea fever dysuria or any other complaints the pain is diffuse and feels like it's "tightening" it is not crampy. It is there constantly. Nothing really seems to make it better or worse.   History reviewed. No pertinent past medical history.  There are no active problems to display for this patient.   Past Surgical History:  Procedure Laterality Date  . ANKLE SURGERY Right     Prior to Admission medications   Not on File    Allergies Patient has no known allergies.  No family history on file.  Social History Social History  Substance Use Topics  . Smoking status: Never Smoker  . Smokeless tobacco: Never Used  . Alcohol use Yes     Comment: social     Review of Systems Constitutional: No fever/chills Eyes: No visual changes. ENT: No sore throat. Cardiovascular: Denies chest pain. Respiratory: Denies shortness of breath. Gastrointestinal: No abdominal pain.  No nausea, no vomiting.  No diarrhea.  No constipation. Genitourinary: Negative for dysuria. Musculoskeletal: Negative for back pain. Skin: Negative for rash. Neurological: Negative for headaches, focal weakness or numbness.   ____________________________________________   PHYSICAL EXAM:  VITAL SIGNS: ED Triage Vitals  Enc Vitals Group     BP 08/07/17 0624 (!) 140/105     Pulse Rate 08/07/17 0624 64     Resp 08/07/17 0624 20     Temp 08/07/17 0624 98.5 F (36.9 C)     Temp Source 08/07/17 0624 Oral     SpO2 08/07/17 0624 98 %     Weight 08/07/17 0623 240 lb (108.9 kg)     Height  08/07/17 0623  (1.448 m)     Head Circumference --      Peak Flow --      Pain Score 08/07/17 0623 10     Pain Loc --      Pain Edu? --      Excl. in GC? --    Constitutional: Alert and oriented. Well appearing and in no acute distress. Eyes: Conjunctivae are normal. PERRL. EOMI. Head: Atraumatic. Nose: No congestion/rhinnorhea. Mouth/Throat: Mucous membranes are moist.  Oropharynx non-erythematous. Neck: No stridor Cardiovascular: Normal rate, regular rhythm. Grossly normal heart sounds.  Good peripheral circulation. Respiratory: Normal respiratory effort.  No retractions. Lungs CTAB. Gastrointestinal: Soft mildly diffusely tender to palpation not to percussion No distention. No abdominal bruits. No CVA tenderness. Musculoskeletal: No lower extremity tenderness nor edema.  No joint effusions. Neurologic:  Normal speech and language. No gross focal neurologic deficits are appreciated. No gait instability. Skin:  Skin is warm, dry and intact. No rash noted. Psychiatric: Mood and affect are normal. Speech and behavior are normal.  ____________________________________________   LABS (all labs ordered are listed, but only abnormal results are displayed)  Labs Reviewed  COMPREHENSIVE METABOLIC PANEL - Abnormal; Notable for the following:       Result Value   Glucose, Bld 104 (*)    Calcium 8.8 (*)    All other components within normal limits  URINALYSIS, COMPLETE (UACMP) WITH MICROSCOPIC - Abnormal; Notable for the following:  Color, Urine YELLOW (*)    APPearance CLEAR (*)    Hgb urine dipstick SMALL (*)    Leukocytes, UA TRACE (*)    Bacteria, UA RARE (*)    Squamous Epithelial / LPF 0-5 (*)    All other components within normal limits  LIPASE, BLOOD  CBC  HCG, QUANTITATIVE, PREGNANCY  PREGNANCY, URINE   ____________________________________________  EKG   ____________________________________________  RADIOLOGY  Ct Abdomen Pelvis W Contrast  Result Date:  08/07/2017 CLINICAL DATA:  Generalized abdominal pain over the last 2 days. EXAM: CT ABDOMEN AND PELVIS WITH CONTRAST TECHNIQUE: Multidetector CT imaging of the abdomen and pelvis was performed using the standard protocol following bolus administration of intravenous contrast. CONTRAST:  ISOVUE-300 IOPAMIDOL (ISOVUE-300) INJECTION 61% COMPARISON:  None. FINDINGS: Lower chest: Normal Hepatobiliary: Normal Pancreas: Normal Spleen: Normal Adrenals/Urinary Tract: Adrenal glands are normal. Kidneys are normal. No cyst, mass, stone or hydronephrosis. Bladder is normal. Stomach/Bowel: No evidence of ileus or obstruction. No sign of inflammatory bowel pathology. No sign of mass lesion. Appendix is normal. Vascular/Lymphatic: Normal Reproductive: Normal Other: No free fluid or air. Musculoskeletal: Normal IMPRESSION: Normal examination.  No abnormality seen to explain abdominal pain. Electronically Signed   By: Paulina Fusi M.D.   On: 08/07/2017 09:00   there is nothing to explain the patient's pain. There is not a lot of stool and gas either. The area around the uterus appears to be a Fields irregular and will get an ultrasound ____________________________________________   PROCEDURES  Procedure(s) performed:   Procedures  Critical Care performed:   ____________________________________________   INITIAL IMPRESSION / ASSESSMENT AND PLAN / ED COURSE  As part of my medical decision making, I reviewed the following data within the electronic MEDICAL RECORD NUMBER          ____________________________________________   FINAL CLINICAL IMPRESSION(S) / ED DIAGNOSES  Final diagnoses:  Pelvic pain      NEW MEDICATIONS STARTED DURING THIS VISIT:  New Prescriptions   No medications on file     Note:  This document was prepared using Dragon voice recognition software and may include unintentional dictation errors.    Arnaldo Natal, MD 08/07/17 1146

## 2017-08-07 NOTE — Discharge Instructions (Signed)
return for worse pain fever or vomiting. Use Advil or Tylenol for the pain. The pain is bad you can take the tramadol 1 pill up to 4 times a day. Please follow-up with your doctor or see [facility] acute-care, or the [facility], or the [facility] clinic, or the [facility] clinic.

## 2017-10-25 ENCOUNTER — Emergency Department: Payer: Medicaid Other

## 2017-10-25 ENCOUNTER — Emergency Department
Admission: EM | Admit: 2017-10-25 | Discharge: 2017-10-25 | Disposition: A | Discharge status: Home / Self Care | Payer: Medicaid Other | Attending: Emergency Medicine | Admitting: Emergency Medicine

## 2017-10-25 ENCOUNTER — Other Ambulatory Visit: Payer: Self-pay

## 2017-10-25 DIAGNOSIS — N898 Other specified noninflammatory disorders of vagina: Secondary | ICD-10-CM | POA: Diagnosis present

## 2017-10-25 DIAGNOSIS — O200 Threatened abortion: Secondary | ICD-10-CM

## 2017-10-25 DIAGNOSIS — N390 Urinary tract infection, site not specified: Secondary | ICD-10-CM | POA: Diagnosis not present

## 2017-10-25 DIAGNOSIS — Z3A00 Weeks of gestation of pregnancy not specified: Secondary | ICD-10-CM | POA: Diagnosis not present

## 2017-10-25 DIAGNOSIS — O208 Other hemorrhage in early pregnancy: Secondary | ICD-10-CM | POA: Insufficient documentation

## 2017-10-25 LAB — URINALYSIS, COMPLETE (UACMP) WITH MICROSCOPIC
BILIRUBIN URINE: NEGATIVE
GLUCOSE, UA: NEGATIVE mg/dL
Ketones, ur: NEGATIVE mg/dL
NITRITE: NEGATIVE
PROTEIN: NEGATIVE mg/dL
Specific Gravity, Urine: 1.028 (ref 1.005–1.030)
pH: 5 (ref 5.0–8.0)

## 2017-10-25 LAB — COMPREHENSIVE METABOLIC PANEL
ALK PHOS: 70 U/L (ref 38–126)
ALT: 17 U/L (ref 14–54)
ANION GAP: 5 (ref 5–15)
AST: 21 U/L (ref 15–41)
Albumin: 3.9 g/dL (ref 3.5–5.0)
BUN: 14 mg/dL (ref 6–20)
CALCIUM: 8.7 mg/dL — AB (ref 8.9–10.3)
CO2: 24 mmol/L (ref 22–32)
Chloride: 106 mmol/L (ref 101–111)
Creatinine, Ser: 0.8 mg/dL (ref 0.44–1.00)
Glucose, Bld: 120 mg/dL — ABNORMAL HIGH (ref 65–99)
POTASSIUM: 4.2 mmol/L (ref 3.5–5.1)
Sodium: 135 mmol/L (ref 135–145)
TOTAL PROTEIN: 7.3 g/dL (ref 6.5–8.1)
Total Bilirubin: 0.5 mg/dL (ref 0.3–1.2)

## 2017-10-25 LAB — ABO/RH: ABO/RH(D): O POS

## 2017-10-25 LAB — WET PREP, GENITAL
Clue Cells Wet Prep HPF POC: NONE SEEN
Sperm: NONE SEEN
TRICH WET PREP: NONE SEEN
Yeast Wet Prep HPF POC: NONE SEEN

## 2017-10-25 LAB — CBC
HCT: 38.6 % (ref 35.0–47.0)
HEMOGLOBIN: 13 g/dL (ref 12.0–16.0)
MCH: 29.2 pg (ref 26.0–34.0)
MCHC: 33.7 g/dL (ref 32.0–36.0)
MCV: 86.6 fL (ref 80.0–100.0)
Platelets: 236 10*3/uL (ref 150–440)
RBC: 4.46 MIL/uL (ref 3.80–5.20)
RDW: 13.6 % (ref 11.5–14.5)
WBC: 6.6 10*3/uL (ref 3.6–11.0)

## 2017-10-25 LAB — LIPASE, BLOOD: Lipase: 28 U/L (ref 11–51)

## 2017-10-25 LAB — HCG, QUANTITATIVE, PREGNANCY: HCG, BETA CHAIN, QUANT, S: 90 m[IU]/mL — AB (ref <5)

## 2017-10-25 LAB — CHLAMYDIA/NGC RT PCR (ARMC ONLY)
Chlamydia Tr: NOT DETECTED
N gonorrhoeae: NOT DETECTED

## 2017-10-25 LAB — POCT PREGNANCY, URINE
Preg Test, Ur: POSITIVE — AB
Preg Test, Ur: POSITIVE — AB

## 2017-10-25 MED ORDER — NITROFURANTOIN MONOHYD MACRO 100 MG PO CAPS
100.0000 mg | ORAL_CAPSULE | Freq: Once | ORAL | Status: AC
  Administered 2017-10-25: 100 mg via ORAL
  Filled 2017-10-25: qty 1

## 2017-10-25 MED ORDER — NITROFURANTOIN MONOHYD MACRO 100 MG PO CAPS: 100.0000 mg | ORAL_CAPSULE | Freq: Two times a day (BID) | ORAL | 0 refills | Status: DC

## 2017-10-25 NOTE — Discharge Instructions (Signed)
You are evaluated for cramping and brown discharge which could be related to bleeding, with a positive pregnancy test with a very low pregnancy hormone level which as we discussed could indicate very early pregnancy versus an abnormal pregnancy such as early miscarriage or ectoptic/tubal pregnancy.  Next step is to have a 48-hour recheck of your pregnancy hormone beta hCG.  Please return to the emergency department on Saturday morning for beta hCG blood draw in the morning.  You will need to follow-up with an OB/GYN doctor, and office number for Dr. Valentino Saxonherry is provided.  Return to the emergency room immediately for any worsening condition including uncontrolled abdominal pain, significant or heavy vaginal bleeding, dizziness or passing out, or any other symptoms concerning to you.

## 2017-10-25 NOTE — ED Provider Notes (Signed)
[facility] Emergency Department Provider Note ____________________________________________   I have reviewed the triage vital signs and the triage nursing note.  HISTORY  Chief Complaint Abdominal Pain   Historian Patient  HPI Miranda Fields is a 26 y.o. female presents to emerge department with brown discharge yesterday vaginally, as well as lower abdominal cramping with some discomfort through to the back.  She took a pregnancy test yesterday x2, one was negative and one was positive.  She reports history of G1 P1.  Last normal period was the beginning of November.  States that she is only had sex in the of December.     No past medical history on file.  There are no active problems to display for this patient.   Past Surgical History:  Procedure Laterality Date  . ANKLE SURGERY Right     Prior to Admission medications   Medication Sig Start Date End Date Taking? Authorizing Provider  nitrofurantoin, macrocrystal-monohydrate, (MACROBID) 100 MG capsule Take 1 capsule (100 mg total) by mouth 2 (two) times daily. 10/25/17   Governor Rooks, MD  traMADol (ULTRAM) 50 MG tablet Take 1 tablet (50 mg total) by mouth every 6 (six) hours as needed. 08/07/17 08/07/18  Arnaldo Natal, MD    No Known Allergies  No family history on file.  Social History Social History   Tobacco Use  . Smoking status: Never Smoker  . Smokeless tobacco: Never Used  Substance Use Topics  . Alcohol use: Yes    Comment: social   . Drug use: Yes    Types: Marijuana    Review of Systems  Constitutional: Negative for fever. Eyes: Negative for visual changes. ENT: Negative for sore throat. Cardiovascular: Negative for chest pain. Respiratory: Negative for shortness of breath. Gastrointestinal: Positive for mild intermittent nausea, emesis only x1.  No diarrhea.  Abdominal cramping as per HPI. Genitourinary: Mild dysuria. Musculoskeletal: Mild low back pain especially  with abdominal cramping. Skin: Negative for rash. Neurological: Negative for headache.  ____________________________________________   PHYSICAL EXAM:  VITAL SIGNS: ED Triage Vitals  Enc Vitals Group     BP 10/25/17 0817 135/86     Pulse Rate 10/25/17 0817 68     Resp 10/25/17 0817 18     Temp 10/25/17 0817 98.4 F (36.9 C)     Temp Source 10/25/17 0817 Oral     SpO2 10/25/17 0817 98 %     Weight --      Height --      Head Circumference --      Peak Flow --      Pain Score 10/25/17 0827 6     Pain Loc --      Pain Edu? --      Excl. in GC? --      Constitutional: Alert and oriented. Well appearing and in no distress. HEENT   Head: Normocephalic and atraumatic.      Eyes: Conjunctivae are normal. Pupils equal and round.       Ears:         Nose: No congestion/rhinnorhea.   Mouth/Throat: Mucous membranes are moist.   Neck: No stridor. Cardiovascular/Chest: Normal rate, regular rhythm.  No murmurs, rubs, or gallops. Respiratory: Normal respiratory effort without tachypnea nor retractions. Breath sounds are clear and equal bilaterally. No wheezes/rales/rhonchi. Gastrointestinal: Soft. No distention, no guarding, no rebound.  Obese, no significant tenderness palpation. Genitourinary/rectal: Small amount of white vaginal discharge, no vaginal bleeding.  No cervicitis or adnexal tenderness  or mass. Musculoskeletal: Nontender with normal range of motion in all extremities. No joint effusions.  No lower extremity tenderness.  No edema. Neurologic:  Normal speech and language. No gross or focal neurologic deficits are appreciated. Skin:  Skin is warm, dry and intact. No rash noted. Psychiatric: Mood and affect are normal. Speech and behavior are normal. Patient exhibits appropriate insight and judgment.   ____________________________________________  LABS (pertinent positives/negatives) I, Governor Rooksebecca Atzel Mccambridge, MD the attending physician have reviewed the labs noted  below.  Labs Reviewed  WET PREP, GENITAL - Abnormal; Notable for the following components:      Result Value   WBC, Wet Prep HPF POC FEW (*)    All other components within normal limits  COMPREHENSIVE METABOLIC PANEL - Abnormal; Notable for the following components:   Glucose, Bld 120 (*)    Calcium 8.7 (*)    All other components within normal limits  URINALYSIS, COMPLETE (UACMP) WITH MICROSCOPIC - Abnormal; Notable for the following components:   Color, Urine YELLOW (*)    APPearance CLEAR (*)    Hgb urine dipstick MODERATE (*)    Leukocytes, UA TRACE (*)    Bacteria, UA RARE (*)    Squamous Epithelial / LPF 0-5 (*)    All other components within normal limits  HCG, QUANTITATIVE, PREGNANCY - Abnormal; Notable for the following components:   hCG, Beta Chain, Quant, S 90 (*)    All other components within normal limits  POCT PREGNANCY, URINE - Abnormal; Notable for the following components:   Preg Test, Ur POSITIVE (*)    All other components within normal limits  POCT PREGNANCY, URINE - Abnormal; Notable for the following components:   Preg Test, Ur POSITIVE (*)    All other components within normal limits  URINE CULTURE  CHLAMYDIA/NGC RT PCR ([facility] ONLY)  LIPASE, BLOOD  CBC  POC URINE PREG, ED  ABO/RH    ____________________________________________    EKG I, Governor Rooksebecca Kieli Golladay, MD, the attending physician have personally viewed and interpreted all ECGs.  None ____________________________________________  RADIOLOGY All Xrays were viewed by me.  Imaging interpreted by Radiologist, and I, Governor Rooksebecca Rito Lecomte, MD the attending physician have reviewed the radiologist interpretation noted below.  Ultrasound pelvis and transvaginal OB less than 14 weeks:  IMPRESSION: 1. No intrauterine gestational sac visualized. Given the history of a positive pregnancy test, differential considerations include intrauterine gestation too early to visualize, completed abortion, or  nonvisualized ectopic pregnancy. Close clinical correlation is recommended with serial beta-hCG and followup ultrasound as warranted. 2. 19 mm complex cystic lesion in the parenchyma of the right ovary. Intra ovarian ectopic pregnancy is uncommon and is likely represents a corpus luteum cyst. Continued close attention recommended. __________________________________________  PROCEDURES  Procedure(s) performed: None  Critical Care performed: None   ____________________________________________  ED COURSE / ASSESSMENT AND PLAN  Pertinent labs & imaging results that were available during my care of the patient were reviewed by me and considered in my medical decision making (see chart for details).   Lower abdominal cramping with some associated intermittent nausea, brown vaginal discharge yesterday, stable vital signs and positive pregnancy test at home as well as here.  Unclear when the actual last menstrual.  Normal would have been, possibly November.  Am adding on beta hCG, will send for ultrasound.  Beta-hCG just 90.  Ultrasound without evidence of IUP.  Will recommend 48-hour repeat beta hCG, at the ED given that will be Saturday.  Pelvic exam not consistent with cervicitis.  Will discharge prior to STD studies resulted as these take a while.  ER nurse would follow-up if these are positive.  DIFFERENTIAL DIAGNOSIS: Differential diagnosis includes, but is not limited to, ovarian cyst, ovarian torsion, acute appendicitis, diverticulitis, urinary tract infection/pyelonephritis, endometriosis, bowel obstruction, colitis, renal colic, gastroenteritis, hernia, fibroids, endometriosis, pregnancy related pain including ectopic pregnancy, etc.  CONSULTATIONS:  None  Patient / Family / Caregiver informed of clinical course, medical decision-making process, and agree with plan.   I discussed return precautions, follow-up instructions, and discharge instructions with patient and/or  family.  Discharge Instructions : You are evaluated for cramping and brown discharge which could be related to bleeding, with a positive pregnancy test with a very low pregnancy hormone level which as we discussed could indicate very early pregnancy versus an abnormal pregnancy.  Next step is to have a 48-hour recheck of your pregnancy hormone beta hCG.  Please return to the emergency department on Saturday morning for beta hCG blood draw in the morning.  You will need to follow-up with an OB/GYN doctor, and office number for Dr. Valentino Saxonherry is provided.  Return to the emergency room immediately for any worsening condition including uncontrolled abdominal pain, significant or heavy vaginal bleeding, dizziness or passing out, or any other symptoms concerning to you.    ___________________________________________   FINAL CLINICAL IMPRESSION(S) / ED DIAGNOSES   Final diagnoses:  Bloody show and cramping in early pregnancy  Urinary tract infection without hematuria, site unspecified      ___________________________________________        Note: This dictation was prepared with Dragon dictation. Any transcriptional errors that result from this process are unintentional    Governor RooksLord, Verbena Boeding, MD 10/25/17 1146

## 2017-10-25 NOTE — ED Notes (Signed)
Patient ambulatory to lobby with NAD noted. Verbalized understanding of discharge instructions and follow-up care. Signature pad in room not working. Hard copy of signature placed in patient's chart.

## 2017-10-25 NOTE — ED Triage Notes (Signed)
Pt reports that she had brown show for about 30 minutes then it quit, her last menstrual cycle was in November. She reports that her "tittes" are sore and is having lower back pain. She reports that she took two pregnancy test yesterday one came back negative the other did not come back as anything.

## 2017-10-26 LAB — URINE CULTURE: CULTURE: NO GROWTH

## 2017-10-27 ENCOUNTER — Encounter: Payer: Self-pay | Admitting: Emergency Medicine

## 2017-10-27 ENCOUNTER — Emergency Department
Admission: EM | Admit: 2017-10-27 | Discharge: 2017-10-27 | Disposition: A | Discharge status: Home / Self Care | Payer: Medicaid Other | Attending: Emergency Medicine | Admitting: Emergency Medicine

## 2017-10-27 DIAGNOSIS — N390 Urinary tract infection, site not specified: Secondary | ICD-10-CM

## 2017-10-27 DIAGNOSIS — Z3A00 Weeks of gestation of pregnancy not specified: Secondary | ICD-10-CM | POA: Diagnosis not present

## 2017-10-27 DIAGNOSIS — O2341 Unspecified infection of urinary tract in pregnancy, first trimester: Secondary | ICD-10-CM | POA: Insufficient documentation

## 2017-10-27 DIAGNOSIS — R109 Unspecified abdominal pain: Secondary | ICD-10-CM | POA: Diagnosis not present

## 2017-10-27 DIAGNOSIS — O9989 Other specified diseases and conditions complicating pregnancy, childbirth and the puerperium: Secondary | ICD-10-CM | POA: Diagnosis present

## 2017-10-27 DIAGNOSIS — O26891 Other specified pregnancy related conditions, first trimester: Secondary | ICD-10-CM

## 2017-10-27 LAB — URINALYSIS, COMPLETE (UACMP) WITH MICROSCOPIC
BILIRUBIN URINE: NEGATIVE
GLUCOSE, UA: NEGATIVE mg/dL
Ketones, ur: 5 mg/dL — AB
NITRITE: NEGATIVE
PH: 5 (ref 5.0–8.0)
Protein, ur: NEGATIVE mg/dL
SPECIFIC GRAVITY, URINE: 1.024 (ref 1.005–1.030)

## 2017-10-27 LAB — COMPREHENSIVE METABOLIC PANEL
ALBUMIN: 3.9 g/dL (ref 3.5–5.0)
ALK PHOS: 67 U/L (ref 38–126)
ALT: 19 U/L (ref 14–54)
ANION GAP: 8 (ref 5–15)
AST: 28 U/L (ref 15–41)
BILIRUBIN TOTAL: 0.6 mg/dL (ref 0.3–1.2)
BUN: 10 mg/dL (ref 6–20)
CALCIUM: 8.9 mg/dL (ref 8.9–10.3)
CO2: 23 mmol/L (ref 22–32)
Chloride: 102 mmol/L (ref 101–111)
Creatinine, Ser: 0.88 mg/dL (ref 0.44–1.00)
GFR calc Af Amer: >60 mL/min (ref >60)
GLUCOSE: 150 mg/dL — AB (ref 65–99)
POTASSIUM: 3.1 mmol/L — AB (ref 3.5–5.1)
Sodium: 133 mmol/L — ABNORMAL LOW (ref 135–145)
TOTAL PROTEIN: 7.3 g/dL (ref 6.5–8.1)

## 2017-10-27 LAB — CBC
HCT: 39 % (ref 35.0–47.0)
Hemoglobin: 13.1 g/dL (ref 12.0–16.0)
MCH: 29 pg (ref 26.0–34.0)
MCHC: 33.6 g/dL (ref 32.0–36.0)
MCV: 86.5 fL (ref 80.0–100.0)
Platelets: 251 10*3/uL (ref 150–440)
RBC: 4.51 MIL/uL (ref 3.80–5.20)
RDW: 13.5 % (ref 11.5–14.5)
WBC: 9.6 10*3/uL (ref 3.6–11.0)

## 2017-10-27 LAB — LIPASE, BLOOD: Lipase: 24 U/L (ref 11–51)

## 2017-10-27 LAB — POCT PREGNANCY, URINE: Preg Test, Ur: NEGATIVE

## 2017-10-27 LAB — PREGNANCY, URINE: Preg Test, Ur: POSITIVE — AB

## 2017-10-27 LAB — HCG, QUANTITATIVE, PREGNANCY: hCG, Beta Chain, Quant, S: 155 m[IU]/mL — ABNORMAL HIGH (ref <5)

## 2017-10-27 NOTE — ED Notes (Signed)
Pt updated on delay. Pt verbalizes understanding.  

## 2017-10-27 NOTE — ED Notes (Signed)
Called lab to add on hCG to blood already in lab.

## 2017-10-27 NOTE — ED Notes (Signed)
Pt updated on delay. Pt states she is thinking about leaving, pt encouraged to stay and be seen.

## 2017-10-27 NOTE — ED Triage Notes (Signed)
Patient c/o lower abdominal and back pain X 3-4 days. Patient seen here on 12/27 for the same. Patient diagnosed with UTI and bloody show/cramping in early pregnancy. Patient denies current bloody/vaginal discharge.

## 2017-10-27 NOTE — ED Provider Notes (Addendum)
[facility] Emergency Department Provider Note ____________________________________________   First MD Initiated Contact with Patient 10/27/17 0406     (approximate)  I have reviewed the triage vital signs and the nursing notes.   HISTORY  Chief Complaint Abdominal Pain and Back Pain    HPI Miranda Fields is a 26 y.o. female with past medical history as noted below who presents with abdominal pain, gradual onset, persistent course over the last several days, and described as crampy and intermittent.  It is mostly in her mid abdomen.  Patient reports that sometimes it radiates to her back.  She states that it has improved somewhat since she was last seen in the ear days ago.  She also had vaginal spotting at that time, which has since resolved.  She denies any urinary symptoms.  The patient was seen in the ED 2 days ago, and had a positive hCG of 90 but no IUP on ultrasound, so was not instructed to return in 2 days for a recheck.  She also was started on antibiotics for a UTI but has only taken 1 dose so far.  No past medical history on file.  There are no active problems to display for this patient.   Past Surgical History:  Procedure Laterality Date  . ANKLE SURGERY Right     Prior to Admission medications   Medication Sig Start Date End Date Taking? Authorizing Provider  nitrofurantoin, macrocrystal-monohydrate, (MACROBID) 100 MG capsule Take 1 capsule (100 mg total) by mouth 2 (two) times daily. 10/25/17  Yes Governor RooksLord, Rebecca, MD  traMADol (ULTRAM) 50 MG tablet Take 1 tablet (50 mg total) by mouth every 6 (six) hours as needed. Patient not taking: Reported on 10/27/2017 08/07/17 08/07/18  Arnaldo NatalMalinda, Paul F, MD    Allergies Patient has no known allergies.  No family history on file.  Social History Social History   Tobacco Use  . Smoking status: Never Smoker  . Smokeless tobacco: Never Used  Substance Use Topics  . Alcohol use: Yes    Comment:  social   . Drug use: Yes    Types: Marijuana    Review of Systems  Constitutional: No fever. Eyes: No redness. ENT: No sore throat. Cardiovascular: Denies chest pain. Respiratory: Denies shortness of breath. Gastrointestinal: Positive for abdominal pain Genitourinary: Negative for vaginal bleeding.  Musculoskeletal:  Positive for back pain. Skin: Negative for rash. Neurological: Negative for headache.   ____________________________________________   PHYSICAL EXAM:  VITAL SIGNS: ED Triage Vitals  Enc Vitals Group     BP 10/27/17 0015 121/70     Pulse Rate 10/27/17 0015 78     Resp 10/27/17 0015 18     Temp 10/27/17 0015 98.3 F (36.8 C)     Temp Source 10/27/17 0015 Oral     SpO2 10/27/17 0015 100 %     Weight 10/27/17 0015 223 lb (101.2 kg)     Height 10/27/17 0015 5\' 1"  (1.549 m)     Head Circumference --      Peak Flow --      Pain Score 10/27/17 0022 10     Pain Loc --      Pain Edu? --      Excl. in GC? --     Constitutional: Alert and oriented. Well appearing and in no acute distress. Eyes: Conjunctivae are normal.  Head: Atraumatic. Nose: No congestion/rhinnorhea. Mouth/Throat: Mucous membranes are moist.   Neck: Normal range of motion.  Cardiovascular:  Good  peripheral circulation. Respiratory: Normal respiratory effort.  Gastrointestinal: Soft and nontender. No distention.  Genitourinary: No CVA tenderness. Musculoskeletal: Extremities warm and well perfused.  Neurologic:  Normal speech and language. No gross focal neurologic deficits are appreciated.  Skin:  Skin is warm and dry. No rash noted. Psychiatric: Mood and affect are normal. Speech and behavior are normal.  ____________________________________________   LABS (all labs ordered are listed, but only abnormal results are displayed)  Labs Reviewed  COMPREHENSIVE METABOLIC PANEL - Abnormal; Notable for the following components:      Result Value   Sodium 133 (*)    Potassium 3.1 (*)     Glucose, Bld 150 (*)    All other components within normal limits  URINALYSIS, COMPLETE (UACMP) WITH MICROSCOPIC - Abnormal; Notable for the following components:   Color, Urine YELLOW (*)    APPearance HAZY (*)    Hgb urine dipstick SMALL (*)    Ketones, ur 5 (*)    Leukocytes, UA SMALL (*)    Bacteria, UA RARE (*)    Squamous Epithelial / LPF 6-30 (*)    All other components within normal limits  PREGNANCY, URINE - Abnormal; Notable for the following components:   Preg Test, Ur POSITIVE (*)    All other components within normal limits  HCG, QUANTITATIVE, PREGNANCY - Abnormal; Notable for the following components:   hCG, Beta Chain, Quant, S 155 (*)    All other components within normal limits  LIPASE, BLOOD  CBC  POC URINE PREG, ED  POCT PREGNANCY, URINE   ____________________________________________  EKG   ____________________________________________  RADIOLOGY    ____________________________________________   PROCEDURES  Procedure(s) performed: No    Critical Care performed: No ____________________________________________   INITIAL IMPRESSION / ASSESSMENT AND PLAN / ED COURSE  Pertinent labs & imaging results that were available during my care of the patient were reviewed by me and considered in my medical decision making (see chart for details).  26 year old female G2P1 with past medical history as noted above presents with abdominal pain and resolved vaginal bleeding, with recent positive hCG but no IUP seen on ultrasound.  Patient was seen in the ED 2 days ago, and instructed to return today for repeat hCG.  I reviewed the past medical records in Epic.  I confirmed that the patient was seen here by Dr. Shaune PollackLord on 12/27.  At that time she had an hCG of 90, and an ultrasound which did not show IUP.  She was instructed to return in 2 days.  She also was diagnosed with a UTI and started on Macrobid.  The patient states that she only took one dose so far.  On  exam, the vital signs are normal, the patient is well-appearing, and the abdomen is soft and nontender.  She states that her vaginal spotting has resolved and the pain is still present but improved.  Differential given positive hCG with no IUP on ultrasound includes most likely early pregnancy, miscarriage, or less likely ectopic given the patient's improving symptoms and reassuring exam.  We will obtain a repeat hCG here, and then I will consult OB/GYN to discuss further plan.  The patient's UA is positive, but she is only taken 1 dose of the Macrobid, so this is consistent with a not yet treated UTI.    ----------------------------------------- 5:31 AM on 10/27/2017 -----------------------------------------  Patient's hCG is now 155.  I consulted Dr. Valentino Saxonherry from OB/GYN, who recommended that given the patient's symptoms had improved, her exam was  reassuring, and the hCG is rising but is still very low, there is no indication for repeat ultrasound today, and the patient is safe to go home.  She recommended giving ectopic precautions, and follow-up later in the week with OB/GYN.  Patient's repeat BP reading in epic is slightly low, but this is after patient has been sleeping and while she is lying on her side.  Patient continues to appear well, and has no clinical evidence of hypotension.  I explained the findings and the plan of care to the patient, and she expressed understanding.  I will provide a referral to OB/GYN for this week.  ____________________________________________   FINAL CLINICAL IMPRESSION(S) / ED DIAGNOSES  Final diagnoses:  Abdominal pain during pregnancy in first trimester  Urinary tract infection without hematuria, site unspecified      NEW MEDICATIONS STARTED DURING THIS VISIT:  This SmartLink is deprecated. Use AVSMEDLIST instead to display the medication list for a patient.   Note:  This document was prepared using Dragon voice recognition software and may  include unintentional dictation errors.    Dionne Bucy, MD 10/27/17 Beryle Flock, MD 10/27/17 [phone]

## 2017-10-27 NOTE — Discharge Instructions (Signed)
Your pregnancy hormone level is rising, but is still very low, indicating an early pregnancy.  You should follow-up with the OB/GYN's office later this week.  A referral has been provided.    It is possible that you have a normal pregnancy that is just very early, but there is a small chance that you could have an ectopic pregnancy, meaning a pregnancy that is not in the normal place.  Return to the emergency department immediately if you have new or worsening pain, vaginal bleeding, abnormal discharge, fever or chills, feeling weak, lightheaded, or passing out, or any other new or worsening symptoms that concern you.    You should continue to take the antibiotic that was prescribed at the last visit, and finish the full course.

## 2017-11-07 ENCOUNTER — Encounter: Payer: Self-pay | Admitting: Emergency Medicine

## 2017-11-07 DIAGNOSIS — R102 Pelvic and perineal pain: Secondary | ICD-10-CM | POA: Insufficient documentation

## 2017-11-07 DIAGNOSIS — Z3A01 Less than 8 weeks gestation of pregnancy: Secondary | ICD-10-CM | POA: Diagnosis not present

## 2017-11-07 DIAGNOSIS — O9989 Other specified diseases and conditions complicating pregnancy, childbirth and the puerperium: Secondary | ICD-10-CM | POA: Insufficient documentation

## 2017-11-07 NOTE — ED Triage Notes (Signed)
Pt c/o left pelvic cramping x1 month. [facility] for same with unknown diagnosis. Pt reports she is pregnant but unknown on how far. Pt had HCG drawn last time she was here in ED.

## 2017-11-08 ENCOUNTER — Emergency Department: Payer: Medicaid Other

## 2017-11-08 ENCOUNTER — Emergency Department
Admission: EM | Admit: 2017-11-08 | Discharge: 2017-11-08 | Disposition: A | Discharge status: Home / Self Care | Payer: Medicaid Other | Attending: Emergency Medicine | Admitting: Emergency Medicine

## 2017-11-08 DIAGNOSIS — Z3491 Encounter for supervision of normal pregnancy, unspecified, first trimester: Secondary | ICD-10-CM

## 2017-11-08 LAB — COMPREHENSIVE METABOLIC PANEL
ALT: 18 U/L (ref 14–54)
ANION GAP: 9 (ref 5–15)
AST: 21 U/L (ref 15–41)
Albumin: 3.9 g/dL (ref 3.5–5.0)
Alkaline Phosphatase: 66 U/L (ref 38–126)
BUN: 9 mg/dL (ref 6–20)
CO2: 24 mmol/L (ref 22–32)
Calcium: 9 mg/dL (ref 8.9–10.3)
Chloride: 104 mmol/L (ref 101–111)
Creatinine, Ser: 0.76 mg/dL (ref 0.44–1.00)
Glucose, Bld: 90 mg/dL (ref 65–99)
Potassium: 3.4 mmol/L — ABNORMAL LOW (ref 3.5–5.1)
Sodium: 137 mmol/L (ref 135–145)
TOTAL PROTEIN: 7.4 g/dL (ref 6.5–8.1)
Total Bilirubin: 0.5 mg/dL (ref 0.3–1.2)

## 2017-11-08 LAB — CBC
HCT: 38.6 % (ref 35.0–47.0)
Hemoglobin: 13 g/dL (ref 12.0–16.0)
MCH: 29.3 pg (ref 26.0–34.0)
MCHC: 33.6 g/dL (ref 32.0–36.0)
MCV: 87.3 fL (ref 80.0–100.0)
PLATELETS: 255 10*3/uL (ref 150–440)
RBC: 4.42 MIL/uL (ref 3.80–5.20)
RDW: 14.1 % (ref 11.5–14.5)
WBC: 8.7 10*3/uL (ref 3.6–11.0)

## 2017-11-08 LAB — HCG, QUANTITATIVE, PREGNANCY: hCG, Beta Chain, Quant, S: 8889 m[IU]/mL — ABNORMAL HIGH (ref <5)

## 2017-11-08 NOTE — ED Provider Notes (Signed)
[facility] Emergency Department Provider Note    First MD Initiated Contact with Patient 11/08/17 [phone]     (approximate)  I have reviewed the triage vital signs and the nursing notes.   HISTORY  Chief Complaint Groin Pain   HPI Miranda Fields is a 27 y.o. female G2 P1 last menstrual period early December" presents to the emergency department with intermittent right pelvic discomfort times approximately 1 month.  Patient states that she has been seen multiple times both at [facility] and [facility] for the same.  Patient was notified that she was pregnant 10/27/2017 [facility] to [facility].  Patient states that she has not follow-up with OB/GYN today.  Patient states current pain score is 3 out of 10.  Patient denies any dysuria no fever.  Patient denies any nausea patient denies any diarrhea or constipation.   Past medical history G2 P1 There are no active problems to display for this patient.   Past Surgical History:  Procedure Laterality Date  . ANKLE SURGERY Right     Prior to Admission medications   Medication Sig Start Date End Date Taking? Authorizing Provider  nitrofurantoin, macrocrystal-monohydrate, (MACROBID) 100 MG capsule Take 1 capsule (100 mg total) by mouth 2 (two) times daily. 10/25/17   Governor RooksLord, Rebecca, MD  traMADol (ULTRAM) 50 MG tablet Take 1 tablet (50 mg total) by mouth every 6 (six) hours as needed. Patient not taking: Reported on 10/27/2017 08/07/17 08/07/18  Arnaldo NatalMalinda, Paul F, MD    Allergies No known drug allergies History reviewed. No pertinent family history.  Social History Social History   Tobacco Use  . Smoking status: Never Smoker  . Smokeless tobacco: Never Used  Substance Use Topics  . Alcohol use: Yes    Comment: social   . Drug use: Yes    Types: Marijuana    Review of Systems Constitutional: No fever/chills Eyes: No visual changes. ENT: No sore throat. Cardiovascular: Denies chest pain. Respiratory: Denies  shortness of breath. Gastrointestinal: No abdominal pain.  No nausea, no vomiting.  No diarrhea.  No constipation. Genitourinary: Negative for dysuria.  Positive for pelvic discomfort Musculoskeletal: Negative for neck pain.  Negative for back pain. Integumentary: Negative for rash. Neurological: Negative for headaches, focal weakness or numbness.  ____________________________________________   PHYSICAL EXAM:  VITAL SIGNS: ED Triage Vitals  Enc Vitals Group     BP 11/07/17 2255 134/83     Pulse Rate 11/07/17 2255 76     Resp 11/07/17 2255 17     Temp 11/07/17 2255 98 F (36.7 C)     Temp Source 11/07/17 2255 Oral     SpO2 11/07/17 2255 100 %     Weight 11/07/17 2255 101.2 kg (223 lb)     Height --      Head Circumference --      Peak Flow --      Pain Score 11/08/17 0158 9     Pain Loc --      Pain Edu? --      Excl. in GC? --     Constitutional: Alert and oriented. Well appearing and in no acute distress. Eyes: Conjunctivae are normal.  Head: Atraumatic. Mouth/Throat: Mucous membranes are moist.Oropharynx non-erythematous. Neck: No stridor.   Cardiovascular: Normal rate, regular rhythm. Good peripheral circulation. Grossly normal heart sounds. Respiratory: Normal respiratory effort.  No retractions. Lungs CTAB. Gastrointestinal: Soft and nontender. No distention.   Musculoskeletal: No lower extremity tenderness nor edema. No gross deformities of extremities.  Neurologic:  Normal speech and language. No gross focal neurologic deficits are appreciated.  Skin:  Skin is warm, dry and intact. No rash noted.   ____________________________________________   LABS (all labs ordered are listed, but only abnormal results are displayed)  Labs Reviewed  HCG, QUANTITATIVE, PREGNANCY - Abnormal; Notable for the following components:      Result Value   hCG, Beta Chain, Quant, S 8,889 (*)    All other components within normal limits  COMPREHENSIVE METABOLIC PANEL - Abnormal;  Notable for the following components:   Potassium 3.4 (*)    All other components within normal limits  CBC   _________  RADIOLOGY I, [place] Remus Hagedorn, personally viewed and evaluated these images (plain radiographs) as part of my medical decision making, as well as reviewing the written report by the radiologist.  US Ob Comp Less 14 Wks  Result Date: 11/08/2017 CLINICAL DATA:  27 year old female with pelvic cramping. Positive HCG levels. LMP: 09/29/2017 corresponding to an estimated gestational age of [redacted] weeks, 5 days. EXAM: OBSTETRIC <14 WK [place] AND TRANSVAGINAL OB US TECHNIQUE: Both transabdominal and transvaginal ultrasound examinations were performed for complete evaluation of the gestation as well as the maternal uterus, adnexal regions, and pelvic cul-de-sac. Transvaginal technique was performed to assess early pregnancy. COMPARISON:  Pelvic ultrasound dated 10/25/2017 FINDINGS: There is a sac-like structure within the endometrium. No definite yolk sac or fetal pole identified. A faint curvilinear echogenicity within this structure may represent an early yolk sac. This sac-like structure may represent an early gestational sac, or a blighted ovum. A pseudo gestation of an ectopic pregnancy is not entirely excluded. Correlation with clinical exam and follow-up with serial HCG levels and ultrasound recommended. If this cystic structure is a true gestational sac the estimated gestational age based on mean sac diameter of 8 mm is 5 weeks, 4 days. Subchorionic hemorrhage:  None visualized. Maternal uterus/adnexae: The maternal ovaries are unremarkable. There is a 2.0 x 2.0 x 2.2 cm corpus luteum in the right ovary. IMPRESSION: Cystic structure within the endometrium as described may represent an early gestational sac. No definite yolk sac or fetal pole identified at this time. Correlation with clinical exam and HCG levels and follow-up with ultrasound in 7 days, or earlier if clinically indicated,  recommended. Right ovarian corpus luteum. Electronically Signed   By: Elgie Collard M.D.   On: 11/08/2017 02:23   US Ob Transvaginal  Result Date: 11/08/2017 CLINICAL DATA:  27 year old female with pelvic cramping. Positive HCG levels. LMP: 09/29/2017 corresponding to an estimated gestational age of [redacted] weeks, 5 days. EXAM: OBSTETRIC <14 WK [place] AND TRANSVAGINAL OB US TECHNIQUE: Both transabdominal and transvaginal ultrasound examinations were performed for complete evaluation of the gestation as well as the maternal uterus, adnexal regions, and pelvic cul-de-sac. Transvaginal technique was performed to assess early pregnancy. COMPARISON:  Pelvic ultrasound dated 10/25/2017 FINDINGS: There is a sac-like structure within the endometrium. No definite yolk sac or fetal pole identified. A faint curvilinear echogenicity within this structure may represent an early yolk sac. This sac-like structure may represent an early gestational sac, or a blighted ovum. A pseudo gestation of an ectopic pregnancy is not entirely excluded. Correlation with clinical exam and follow-up with serial HCG levels and ultrasound recommended. If this cystic structure is a true gestational sac the estimated gestational age based on mean sac diameter of 8 mm is 5 weeks, 4 days. Subchorionic hemorrhage:  None visualized. Maternal uterus/adnexae: The maternal ovaries are unremarkable. There is  a 2.0 x 2.0 x 2.2 cm corpus luteum in the right ovary. IMPRESSION: Cystic structure within the endometrium as described may represent an early gestational sac. No definite yolk sac or fetal pole identified at this time. Correlation with clinical exam and HCG levels and follow-up with ultrasound in 7 days, or earlier if clinically indicated, recommended. Right ovarian corpus luteum. Electronically Signed   By: Elgie Collard M.D.   On: 11/08/2017 02:23      Procedures   ____________________________________________   INITIAL IMPRESSION /  ASSESSMENT AND PLAN / ED COURSE  As part of my medical decision making, I reviewed the following data within the electronic MEDICAL RECORD 68 year old female presented with above-stated history and physical exam.  Spoke with the patient at length regarding ultrasound findings of cystic structure noted in the endometrium without a fetal pole.  Patient is advised to follow-up with OB/GYN and have a repeat ultrasound performed within a week. ____________________________________________  FINAL CLINICAL IMPRESSION(S) / ED DIAGNOSES  Final diagnoses:  First trimester pregnancy     MEDICATIONS GIVEN DURING THIS VISIT:  Medications - No data to display   ED Discharge Orders    None       Note:  This document was prepared using Dragon voice recognition software and may include unintentional dictation errors.    Darci Current, MD 11/08/17 [phone]

## 2017-11-08 NOTE — ED Notes (Signed)
Pt states she is having sharp pains that come and go in her right lower abdomen, tender to touch. Pt states she is currently pregnant with LMP in early December. Pt is concerned she may be having complications related to the pregnancy.

## 2017-11-08 NOTE — ED Notes (Addendum)
No answer when called for exam room by another nurse, patient currently in ultrasound.

## 2017-11-23 ENCOUNTER — Encounter: Payer: Self-pay | Admitting: Obstetrics & Gynecology

## 2017-11-28 ENCOUNTER — Encounter: Payer: Self-pay | Admitting: Maternal Newborn

## 2017-12-17 DIAGNOSIS — Z8759 Personal history of other complications of pregnancy, childbirth and the puerperium: Secondary | ICD-10-CM | POA: Insufficient documentation

## 2017-12-20 ENCOUNTER — Emergency Department: Payer: Medicaid Other

## 2017-12-20 ENCOUNTER — Encounter: Payer: Self-pay | Admitting: Emergency Medicine

## 2017-12-20 ENCOUNTER — Other Ambulatory Visit: Payer: Self-pay

## 2017-12-20 ENCOUNTER — Emergency Department
Admission: EM | Admit: 2017-12-20 | Discharge: 2017-12-20 | Disposition: A | Discharge status: Home / Self Care | Payer: Medicaid Other | Attending: Student in an Organized Health Care Education/Training Program | Admitting: Student in an Organized Health Care Education/Training Program

## 2017-12-20 DIAGNOSIS — J029 Acute pharyngitis, unspecified: Secondary | ICD-10-CM | POA: Insufficient documentation

## 2017-12-20 DIAGNOSIS — R319 Hematuria, unspecified: Secondary | ICD-10-CM | POA: Diagnosis not present

## 2017-12-20 DIAGNOSIS — R1031 Right lower quadrant pain: Secondary | ICD-10-CM | POA: Insufficient documentation

## 2017-12-20 LAB — URINALYSIS, COMPLETE (UACMP) WITH MICROSCOPIC
BILIRUBIN URINE: NEGATIVE
GLUCOSE, UA: NEGATIVE mg/dL
Ketones, ur: NEGATIVE mg/dL
Nitrite: NEGATIVE
PH: 6 (ref 5.0–8.0)
Protein, ur: NEGATIVE mg/dL
SPECIFIC GRAVITY, URINE: 1.008 (ref 1.005–1.030)

## 2017-12-20 LAB — COMPREHENSIVE METABOLIC PANEL
ALK PHOS: 72 U/L (ref 38–126)
ALT: 19 U/L (ref 14–54)
AST: 21 U/L (ref 15–41)
Albumin: 4.1 g/dL (ref 3.5–5.0)
Anion gap: 11 (ref 5–15)
BILIRUBIN TOTAL: 0.5 mg/dL (ref 0.3–1.2)
BUN: 12 mg/dL (ref 6–20)
CALCIUM: 9 mg/dL (ref 8.9–10.3)
CO2: 22 mmol/L (ref 22–32)
CREATININE: 0.73 mg/dL (ref 0.44–1.00)
Chloride: 103 mmol/L (ref 101–111)
Glucose, Bld: 95 mg/dL (ref 65–99)
Potassium: 4 mmol/L (ref 3.5–5.1)
Sodium: 136 mmol/L (ref 135–145)
Total Protein: 7.5 g/dL (ref 6.5–8.1)

## 2017-12-20 LAB — CBC
HCT: 37.9 % (ref 35.0–47.0)
Hemoglobin: 12.8 g/dL (ref 12.0–16.0)
MCH: 29.3 pg (ref 26.0–34.0)
MCHC: 33.6 g/dL (ref 32.0–36.0)
MCV: 87.1 fL (ref 80.0–100.0)
PLATELETS: 256 10*3/uL (ref 150–440)
RBC: 4.36 MIL/uL (ref 3.80–5.20)
RDW: 14.1 % (ref 11.5–14.5)
WBC: 7.3 10*3/uL (ref 3.6–11.0)

## 2017-12-20 LAB — LIPASE, BLOOD: Lipase: 26 U/L (ref 11–51)

## 2017-12-20 LAB — PREGNANCY, URINE: Preg Test, Ur: NEGATIVE

## 2017-12-20 MED ORDER — HYDROCODONE-ACETAMINOPHEN 5-325 MG PO TABS: 1.0000 | ORAL_TABLET | Freq: Once | ORAL | Status: DC

## 2017-12-20 MED ORDER — AMOXICILLIN 500 MG PO CAPS: 500.0000 mg | ORAL_CAPSULE | Freq: Three times a day (TID) | ORAL | 0 refills | Status: AC

## 2017-12-20 MED ORDER — AMOXICILLIN 500 MG PO CAPS: 500.0000 mg | ORAL_CAPSULE | Freq: Once | ORAL | Status: DC

## 2017-12-20 MED ORDER — HYDROCODONE-ACETAMINOPHEN 5-325 MG PO TABS: 1.0000 | ORAL_TABLET | ORAL | 0 refills | Status: DC | PRN

## 2017-12-20 MED ORDER — PROMETHAZINE HCL 12.5 MG PO TABS: 12.5000 mg | ORAL_TABLET | Freq: Four times a day (QID) | ORAL | 0 refills | Status: DC | PRN

## 2017-12-20 MED ORDER — DEXAMETHASONE 4 MG PO TABS
10.0000 mg | ORAL_TABLET | Freq: Once | ORAL | Status: AC
  Administered 2017-12-20: 10 mg via ORAL
  Filled 2017-12-20: qty 2.5

## 2017-12-20 NOTE — Discharge Instructions (Signed)
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
## 2017-12-20 NOTE — ED Triage Notes (Signed)
Pt states right lower quadrant pain that moves around to right flank pain, states known right ovarian cyst as well. Appears in no distress, states she wants her throat checked as well.

## 2017-12-21 LAB — HM PAP SMEAR: HM Pap smear: NEGATIVE

## 2018-02-18 ENCOUNTER — Emergency Department
Admission: EM | Admit: 2018-02-18 | Discharge: 2018-02-18 | Disposition: A | Discharge status: Home / Self Care | Payer: Medicaid Other | Attending: Student in an Organized Health Care Education/Training Program | Admitting: Student in an Organized Health Care Education/Training Program

## 2018-02-18 ENCOUNTER — Emergency Department: Payer: Medicaid Other

## 2018-02-18 ENCOUNTER — Encounter: Payer: Self-pay | Admitting: Medical Oncology

## 2018-02-18 DIAGNOSIS — R1032 Left lower quadrant pain: Secondary | ICD-10-CM | POA: Diagnosis present

## 2018-02-18 DIAGNOSIS — Z79899 Other long term (current) drug therapy: Secondary | ICD-10-CM | POA: Diagnosis not present

## 2018-02-18 LAB — COMPREHENSIVE METABOLIC PANEL
ALBUMIN: 4.1 g/dL (ref 3.5–5.0)
ALT: 16 U/L (ref 14–54)
ANION GAP: 6 (ref 5–15)
AST: 20 U/L (ref 15–41)
Alkaline Phosphatase: 76 U/L (ref 38–126)
BILIRUBIN TOTAL: 0.7 mg/dL (ref 0.3–1.2)
BUN: 11 mg/dL (ref 6–20)
CO2: 29 mmol/L (ref 22–32)
Calcium: 8.7 mg/dL — ABNORMAL LOW (ref 8.9–10.3)
Chloride: 103 mmol/L (ref 101–111)
Creatinine, Ser: 0.77 mg/dL (ref 0.44–1.00)
GFR calc Af Amer: >60 mL/min (ref >60)
GLUCOSE: 98 mg/dL (ref 65–99)
POTASSIUM: 3.9 mmol/L (ref 3.5–5.1)
Sodium: 138 mmol/L (ref 135–145)
TOTAL PROTEIN: 7.4 g/dL (ref 6.5–8.1)

## 2018-02-18 LAB — URINALYSIS, COMPLETE (UACMP) WITH MICROSCOPIC
BILIRUBIN URINE: NEGATIVE
Glucose, UA: NEGATIVE mg/dL
KETONES UR: NEGATIVE mg/dL
NITRITE: NEGATIVE
Protein, ur: NEGATIVE mg/dL
Specific Gravity, Urine: 1.017 (ref 1.005–1.030)
pH: 6 (ref 5.0–8.0)

## 2018-02-18 LAB — CBC
HEMATOCRIT: 42 % (ref 35.0–47.0)
HEMOGLOBIN: 14.2 g/dL (ref 12.0–16.0)
MCH: 29.6 pg (ref 26.0–34.0)
MCHC: 33.8 g/dL (ref 32.0–36.0)
MCV: 87.5 fL (ref 80.0–100.0)
Platelets: 238 10*3/uL (ref 150–440)
RBC: 4.8 MIL/uL (ref 3.80–5.20)
RDW: 13.8 % (ref 11.5–14.5)
WBC: 7.8 10*3/uL (ref 3.6–11.0)

## 2018-02-18 LAB — LIPASE, BLOOD: Lipase: 27 U/L (ref 11–51)

## 2018-02-18 LAB — PREGNANCY, URINE: PREG TEST UR: NEGATIVE

## 2018-02-18 MED ORDER — PROMETHAZINE HCL 25 MG PO TABS
12.5000 mg | ORAL_TABLET | Freq: Once | ORAL | Status: AC
  Administered 2018-02-18: 12.5 mg via ORAL
  Filled 2018-02-18: qty 1

## 2018-02-18 MED ORDER — TRAMADOL HCL 50 MG PO TABS: 50.0000 mg | ORAL_TABLET | Freq: Four times a day (QID) | ORAL | 0 refills | Status: DC | PRN

## 2018-02-18 MED ORDER — PROMETHAZINE HCL 12.5 MG PO TABS: 12.5000 mg | ORAL_TABLET | Freq: Four times a day (QID) | ORAL | 0 refills | Status: DC | PRN

## 2018-02-18 MED ORDER — HYDROCODONE-ACETAMINOPHEN 5-325 MG PO TABS
1.0000 | ORAL_TABLET | Freq: Once | ORAL | Status: AC
  Administered 2018-02-18: 1 via ORAL
  Filled 2018-02-18: qty 1

## 2018-02-18 NOTE — Discharge Instructions (Signed)

## 2018-02-18 NOTE — ED Triage Notes (Signed)
Pt reports generalized abd pain x 2 days with nausea and vomiting.
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
## 2018-02-19 LAB — POCT PREGNANCY, URINE: PREG TEST UR: NEGATIVE

## 2018-03-26 ENCOUNTER — Emergency Department
Admission: EM | Admit: 2018-03-26 | Discharge: 2018-03-26 | Discharge status: Against Medical Advice | Payer: Medicaid Other

## 2018-03-26 NOTE — ED Notes (Signed)
Pt called in the WR with no response 

## 2018-04-10 ENCOUNTER — Encounter: Payer: Self-pay | Admitting: Emergency Medicine

## 2018-04-10 ENCOUNTER — Emergency Department
Admission: EM | Admit: 2018-04-10 | Discharge: 2018-04-10 | Disposition: A | Discharge status: Home / Self Care | Payer: Medicaid Other | Attending: Emergency Medicine | Admitting: Emergency Medicine

## 2018-04-10 DIAGNOSIS — Z79899 Other long term (current) drug therapy: Secondary | ICD-10-CM | POA: Insufficient documentation

## 2018-04-10 DIAGNOSIS — Z3202 Encounter for pregnancy test, result negative: Secondary | ICD-10-CM | POA: Insufficient documentation

## 2018-04-10 DIAGNOSIS — R1012 Left upper quadrant pain: Secondary | ICD-10-CM | POA: Insufficient documentation

## 2018-04-10 LAB — COMPREHENSIVE METABOLIC PANEL
ALBUMIN: 3.8 g/dL (ref 3.5–5.0)
ALT: 18 U/L (ref 14–54)
ANION GAP: 7 (ref 5–15)
AST: 19 U/L (ref 15–41)
Alkaline Phosphatase: 65 U/L (ref 38–126)
BUN: 14 mg/dL (ref 6–20)
CO2: 26 mmol/L (ref 22–32)
Calcium: 8.8 mg/dL — ABNORMAL LOW (ref 8.9–10.3)
Chloride: 106 mmol/L (ref 101–111)
Creatinine, Ser: 0.85 mg/dL (ref 0.44–1.00)
GFR calc Af Amer: >60 mL/min (ref >60)
GFR calc non Af Amer: >60 mL/min (ref >60)
GLUCOSE: 107 mg/dL — AB (ref 65–99)
POTASSIUM: 4.3 mmol/L (ref 3.5–5.1)
Sodium: 139 mmol/L (ref 135–145)
Total Bilirubin: 0.5 mg/dL (ref 0.3–1.2)
Total Protein: 7.1 g/dL (ref 6.5–8.1)

## 2018-04-10 LAB — URINALYSIS, COMPLETE (UACMP) WITH MICROSCOPIC
Bacteria, UA: NONE SEEN
Bilirubin Urine: NEGATIVE
Glucose, UA: NEGATIVE mg/dL
Ketones, ur: NEGATIVE mg/dL
Nitrite: NEGATIVE
Protein, ur: NEGATIVE mg/dL
Specific Gravity, Urine: 1.021 (ref 1.005–1.030)
pH: 5 (ref 5.0–8.0)

## 2018-04-10 LAB — CBC
HCT: 38 % (ref 35.0–47.0)
Hemoglobin: 12.9 g/dL (ref 12.0–16.0)
MCH: 29.7 pg (ref 26.0–34.0)
MCHC: 34.1 g/dL (ref 32.0–36.0)
MCV: 87.1 fL (ref 80.0–100.0)
Platelets: 252 10*3/uL (ref 150–440)
RBC: 4.36 MIL/uL (ref 3.80–5.20)
RDW: 13.5 % (ref 11.5–14.5)
WBC: 5.4 10*3/uL (ref 3.6–11.0)

## 2018-04-10 LAB — LIPASE, BLOOD: Lipase: 26 U/L (ref 11–51)

## 2018-04-10 LAB — POCT PREGNANCY, URINE: Preg Test, Ur: NEGATIVE

## 2018-04-10 NOTE — ED Triage Notes (Signed)
Pt reports LMP was a week ago but lasted only 3 days and she usually has it for atleast 6 days.

## 2018-04-10 NOTE — ED Triage Notes (Signed)
Pt reports abdominal pain constant since having her IUD removed a month ago. Pt reports pani to her lower abd and back is intermittent but pain to her upper abd is constant. Denies NVD, SOB, urinary sx'x or other sx's.
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
## 2018-06-04 ENCOUNTER — Emergency Department
Admission: EM | Admit: 2018-06-04 | Discharge: 2018-06-04 | Disposition: A | Discharge status: Home / Self Care | Payer: Medicaid Other | Attending: Emergency Medicine | Admitting: Emergency Medicine

## 2018-06-04 ENCOUNTER — Other Ambulatory Visit: Payer: Self-pay

## 2018-06-04 DIAGNOSIS — R109 Unspecified abdominal pain: Secondary | ICD-10-CM | POA: Diagnosis not present

## 2018-06-04 LAB — URINALYSIS, COMPLETE (UACMP) WITH MICROSCOPIC
BACTERIA UA: NONE SEEN
BILIRUBIN URINE: NEGATIVE
GLUCOSE, UA: NEGATIVE mg/dL
KETONES UR: NEGATIVE mg/dL
Nitrite: NEGATIVE
PROTEIN: NEGATIVE mg/dL
Specific Gravity, Urine: 1.023 (ref 1.005–1.030)
pH: 6 (ref 5.0–8.0)

## 2018-06-04 LAB — COMPREHENSIVE METABOLIC PANEL
ALBUMIN: 3.9 g/dL (ref 3.5–5.0)
ALT: 19 U/L (ref 0–44)
ANION GAP: 4 — AB (ref 5–15)
AST: 23 U/L (ref 15–41)
Alkaline Phosphatase: 68 U/L (ref 38–126)
BILIRUBIN TOTAL: 0.5 mg/dL (ref 0.3–1.2)
BUN: 12 mg/dL (ref 6–20)
CHLORIDE: 108 mmol/L (ref 98–111)
CO2: 29 mmol/L (ref 22–32)
Calcium: 8.6 mg/dL — ABNORMAL LOW (ref 8.9–10.3)
Creatinine, Ser: 0.85 mg/dL (ref 0.44–1.00)
GFR calc Af Amer: >60 mL/min (ref >60)
Glucose, Bld: 93 mg/dL (ref 70–99)
POTASSIUM: 4 mmol/L (ref 3.5–5.1)
Sodium: 141 mmol/L (ref 135–145)
TOTAL PROTEIN: 7.1 g/dL (ref 6.5–8.1)

## 2018-06-04 LAB — CBC
HEMATOCRIT: 37.6 % (ref 35.0–47.0)
HEMOGLOBIN: 12.7 g/dL (ref 12.0–16.0)
MCH: 29.6 pg (ref 26.0–34.0)
MCHC: 33.7 g/dL (ref 32.0–36.0)
MCV: 87.6 fL (ref 80.0–100.0)
Platelets: 277 10*3/uL (ref 150–440)
RBC: 4.29 MIL/uL (ref 3.80–5.20)
RDW: 13.4 % (ref 11.5–14.5)
WBC: 7.6 10*3/uL (ref 3.6–11.0)

## 2018-06-04 LAB — POCT PREGNANCY, URINE: Preg Test, Ur: NEGATIVE

## 2018-06-04 LAB — LIPASE, BLOOD: LIPASE: 29 U/L (ref 11–51)

## 2018-06-04 NOTE — ED Notes (Signed)
AAOx3.  Skin warm and dry.  NAD 
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
## 2018-06-04 NOTE — ED Triage Notes (Signed)
Lower abdominal and lower back pain X 1.5 weeks. Nausea, emesis. Pt alert and oriented X4, active, cooperative, pt in NAD. RR even and unlabored, color WNL.

## 2018-06-04 NOTE — Discharge Instructions (Addendum)
Prefer not to stay for pelvic exam or further work-up which is certainly your choice but limits our  ability to evaluate you.  Please if you feel worse in the any way return to the emergency room.  Follow closely with the primary care doctor listed above.  Without pelvic exam another evaluation is hard for us to tell you what is going on although you have had them here before without a satisfactory answer as you report.  Nonetheless, we are happy to take care of you any further if you change your mind.  I hope your grandmother is doing well

## 2018-06-06 LAB — URINE CULTURE

## 2018-06-10 ENCOUNTER — Emergency Department
Admission: EM | Admit: 2018-06-10 | Discharge: 2018-06-11 | Disposition: A | Discharge status: Home / Self Care | Payer: Medicaid Other | Attending: Emergency Medicine | Admitting: Emergency Medicine

## 2018-06-10 ENCOUNTER — Other Ambulatory Visit: Payer: Self-pay

## 2018-06-10 DIAGNOSIS — Z79899 Other long term (current) drug therapy: Secondary | ICD-10-CM | POA: Insufficient documentation

## 2018-06-10 DIAGNOSIS — R1031 Right lower quadrant pain: Secondary | ICD-10-CM | POA: Insufficient documentation

## 2018-06-10 DIAGNOSIS — R109 Unspecified abdominal pain: Secondary | ICD-10-CM

## 2018-06-10 LAB — URINALYSIS, COMPLETE (UACMP) WITH MICROSCOPIC
BILIRUBIN URINE: NEGATIVE
Glucose, UA: NEGATIVE mg/dL
KETONES UR: NEGATIVE mg/dL
NITRITE: NEGATIVE
PROTEIN: NEGATIVE mg/dL
Specific Gravity, Urine: 1.019 (ref 1.005–1.030)
pH: 5 (ref 5.0–8.0)

## 2018-06-10 LAB — COMPREHENSIVE METABOLIC PANEL
ALBUMIN: 4 g/dL (ref 3.5–5.0)
ALT: 18 U/L (ref 0–44)
ANION GAP: 5 (ref 5–15)
AST: 22 U/L (ref 15–41)
Alkaline Phosphatase: 68 U/L (ref 38–126)
BILIRUBIN TOTAL: 0.6 mg/dL (ref 0.3–1.2)
BUN: 15 mg/dL (ref 6–20)
CALCIUM: 8.9 mg/dL (ref 8.9–10.3)
CO2: 28 mmol/L (ref 22–32)
Chloride: 106 mmol/L (ref 98–111)
Creatinine, Ser: 0.78 mg/dL (ref 0.44–1.00)
GFR calc Af Amer: >60 mL/min (ref >60)
GFR calc non Af Amer: >60 mL/min (ref >60)
GLUCOSE: 87 mg/dL (ref 70–99)
Potassium: 3.6 mmol/L (ref 3.5–5.1)
Sodium: 139 mmol/L (ref 135–145)
TOTAL PROTEIN: 7.3 g/dL (ref 6.5–8.1)

## 2018-06-10 LAB — POCT PREGNANCY, URINE: PREG TEST UR: NEGATIVE

## 2018-06-10 LAB — CBC
HCT: 36.6 % (ref 35.0–47.0)
Hemoglobin: 12.4 g/dL (ref 12.0–16.0)
MCH: 29.4 pg (ref 26.0–34.0)
MCHC: 33.8 g/dL (ref 32.0–36.0)
MCV: 86.9 fL (ref 80.0–100.0)
Platelets: 262 10*3/uL (ref 150–440)
RBC: 4.21 MIL/uL (ref 3.80–5.20)
RDW: 13.7 % (ref 11.5–14.5)
WBC: 8.4 10*3/uL (ref 3.6–11.0)

## 2018-06-10 LAB — LIPASE, BLOOD: Lipase: 28 U/L (ref 11–51)

## 2018-06-10 NOTE — ED Triage Notes (Addendum)
Pt arrives to ED via POV for generalized abdominal pain x1 week. No fever, "a little vomiting and diarrhea". Pt denies CP or SHOB. Pt reports being seen here recently for same but without definite d/x.

## 2018-06-10 NOTE — ED Notes (Signed)
Report given to Kala RN. 

## 2018-06-11 ENCOUNTER — Emergency Department: Payer: Medicaid Other

## 2018-06-11 LAB — CHLAMYDIA/NGC RT PCR (ARMC ONLY)
CHLAMYDIA TR: NOT DETECTED
N gonorrhoeae: NOT DETECTED

## 2018-06-11 LAB — WET PREP, GENITAL
Trich, Wet Prep: NONE SEEN
Yeast Wet Prep HPF POC: NONE SEEN

## 2018-06-11 LAB — HCG, QUANTITATIVE, PREGNANCY: hCG, Beta Chain, Quant, S: <1 m[IU]/mL (ref <5)

## 2018-06-11 MED ORDER — KETOROLAC TROMETHAMINE 60 MG/2ML IM SOLN
60.0000 mg | Freq: Once | INTRAMUSCULAR | Status: AC
  Administered 2018-06-11: 60 mg via INTRAMUSCULAR
  Filled 2018-06-11: qty 2

## 2018-06-11 NOTE — Discharge Instructions (Addendum)
Please follow up with the acute care clinic and with OB/GYN for further evaluation of your abdominal pain. Please return with any worse pains or any other concerns
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
## 2018-06-12 LAB — URINE CULTURE

## 2018-09-03 LAB — HM HIV SCREENING LAB: HM HIV Screening: NEGATIVE

## 2018-09-05 ENCOUNTER — Other Ambulatory Visit: Payer: Self-pay

## 2018-09-05 ENCOUNTER — Emergency Department
Admission: EM | Admit: 2018-09-05 | Discharge: 2018-09-05 | Disposition: A | Discharge status: Home / Self Care | Payer: Medicaid Other | Attending: Emergency Medicine | Admitting: Emergency Medicine

## 2018-09-05 DIAGNOSIS — R1030 Lower abdominal pain, unspecified: Secondary | ICD-10-CM | POA: Diagnosis not present

## 2018-09-05 DIAGNOSIS — R109 Unspecified abdominal pain: Secondary | ICD-10-CM | POA: Diagnosis present

## 2018-09-05 DIAGNOSIS — Z79899 Other long term (current) drug therapy: Secondary | ICD-10-CM | POA: Insufficient documentation

## 2018-09-05 DIAGNOSIS — M545 Low back pain, unspecified: Secondary | ICD-10-CM

## 2018-09-05 LAB — URINALYSIS, COMPLETE (UACMP) WITH MICROSCOPIC
Bilirubin Urine: NEGATIVE
GLUCOSE, UA: NEGATIVE mg/dL
Ketones, ur: NEGATIVE mg/dL
NITRITE: NEGATIVE
PH: 7 (ref 5.0–8.0)
PROTEIN: NEGATIVE mg/dL
SPECIFIC GRAVITY, URINE: 1.013 (ref 1.005–1.030)

## 2018-09-05 LAB — CBC
HCT: 39.3 % (ref 36.0–46.0)
Hemoglobin: 12.7 g/dL (ref 12.0–15.0)
MCH: 28.7 pg (ref 26.0–34.0)
MCHC: 32.3 g/dL (ref 30.0–36.0)
MCV: 88.7 fL (ref 80.0–100.0)
NRBC: 0 % (ref 0.0–0.2)
Platelets: 237 10*3/uL (ref 150–400)
RBC: 4.43 MIL/uL (ref 3.87–5.11)
RDW: 13.1 % (ref 11.5–15.5)
WBC: 5.8 10*3/uL (ref 4.0–10.5)

## 2018-09-05 LAB — COMPREHENSIVE METABOLIC PANEL
ALT: 16 U/L (ref 0–44)
ANION GAP: 6 (ref 5–15)
AST: 17 U/L (ref 15–41)
Albumin: 3.8 g/dL (ref 3.5–5.0)
Alkaline Phosphatase: 63 U/L (ref 38–126)
BILIRUBIN TOTAL: 0.6 mg/dL (ref 0.3–1.2)
BUN: 14 mg/dL (ref 6–20)
CALCIUM: 9.1 mg/dL (ref 8.9–10.3)
CO2: 29 mmol/L (ref 22–32)
Chloride: 104 mmol/L (ref 98–111)
Creatinine, Ser: 0.89 mg/dL (ref 0.44–1.00)
Glucose, Bld: 102 mg/dL — ABNORMAL HIGH (ref 70–99)
Potassium: 4.6 mmol/L (ref 3.5–5.1)
Sodium: 139 mmol/L (ref 135–145)
TOTAL PROTEIN: 7.2 g/dL (ref 6.5–8.1)

## 2018-09-05 LAB — URINE DRUG SCREEN, QUALITATIVE (ARMC ONLY)
Amphetamines, Ur Screen: NOT DETECTED
BENZODIAZEPINE, UR SCRN: NOT DETECTED
Barbiturates, Ur Screen: NOT DETECTED
Cannabinoid 50 Ng, Ur ~~LOC~~: NOT DETECTED
Cocaine Metabolite,Ur ~~LOC~~: NOT DETECTED
MDMA (ECSTASY) UR SCREEN: NOT DETECTED
METHADONE SCREEN, URINE: NOT DETECTED
OPIATE, UR SCREEN: NOT DETECTED
PHENCYCLIDINE (PCP) UR S: NOT DETECTED
Tricyclic, Ur Screen: NOT DETECTED

## 2018-09-05 LAB — LIPASE, BLOOD: Lipase: 34 U/L (ref 11–51)

## 2018-09-05 LAB — POCT PREGNANCY, URINE: Preg Test, Ur: NEGATIVE

## 2018-09-05 MED ORDER — IBUPROFEN 600 MG PO TABS
600.0000 mg | ORAL_TABLET | Freq: Once | ORAL | Status: AC
  Administered 2018-09-05: 600 mg via ORAL

## 2018-09-05 MED ORDER — IBUPROFEN 600 MG PO TABS
ORAL_TABLET | ORAL | Status: AC
  Filled 2018-09-05: qty 1
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
## 2018-09-05 NOTE — ED Notes (Signed)
Called lab to inquire about add on urine culture and UDS. Per Tyron Russell they will add on to sample in lab.

## 2018-09-05 NOTE — Discharge Instructions (Addendum)
return to the emergency room for any new or worrisome symptoms, please follow closely as an outpatient, take over-the-counter medications for pain. If you  have numbness or weakness increased abdominal pain, vomiting, fever, or other concerns return to the ER

## 2018-09-05 NOTE — ED Triage Notes (Signed)
Pt c/o intermittent lower abd pain with mid and upper back pain for the past 2 months , states she has been here for the same sx with no dx several times. Pt states she has been having diarrhea. Denies N/V.Marland Kitchen

## 2018-09-06 LAB — URINE CULTURE: Culture: 30000 — AB

## 2018-11-07 ENCOUNTER — Emergency Department
Admission: EM | Admit: 2018-11-07 | Discharge: 2018-11-07 | Disposition: A | Discharge status: Home / Self Care | Payer: Medicaid Other | Attending: Emergency Medicine | Admitting: Emergency Medicine

## 2018-11-07 DIAGNOSIS — R109 Unspecified abdominal pain: Secondary | ICD-10-CM | POA: Insufficient documentation

## 2018-11-07 DIAGNOSIS — Z5321 Procedure and treatment not carried out due to patient leaving prior to being seen by health care provider: Secondary | ICD-10-CM | POA: Insufficient documentation

## 2018-11-07 LAB — COMPREHENSIVE METABOLIC PANEL
ALBUMIN: 3.9 g/dL (ref 3.5–5.0)
ALK PHOS: 60 U/L (ref 38–126)
ALT: 28 U/L (ref 0–44)
AST: 25 U/L (ref 15–41)
Anion gap: 7 (ref 5–15)
BILIRUBIN TOTAL: 0.4 mg/dL (ref 0.3–1.2)
BUN: 9 mg/dL (ref 6–20)
CO2: 25 mmol/L (ref 22–32)
Calcium: 9 mg/dL (ref 8.9–10.3)
Chloride: 105 mmol/L (ref 98–111)
Creatinine, Ser: 0.8 mg/dL (ref 0.44–1.00)
GFR calc Af Amer: >60 mL/min (ref >60)
GFR calc non Af Amer: >60 mL/min (ref >60)
GLUCOSE: 99 mg/dL (ref 70–99)
Potassium: 4 mmol/L (ref 3.5–5.1)
Sodium: 137 mmol/L (ref 135–145)
TOTAL PROTEIN: 7.3 g/dL (ref 6.5–8.1)

## 2018-11-07 LAB — CBC
HEMATOCRIT: 40.4 % (ref 36.0–46.0)
HEMOGLOBIN: 13.1 g/dL (ref 12.0–15.0)
MCH: 28.4 pg (ref 26.0–34.0)
MCHC: 32.4 g/dL (ref 30.0–36.0)
MCV: 87.4 fL (ref 80.0–100.0)
NRBC: 0 % (ref 0.0–0.2)
Platelets: 281 10*3/uL (ref 150–400)
RBC: 4.62 MIL/uL (ref 3.87–5.11)
RDW: 13.3 % (ref 11.5–15.5)
WBC: 6.9 10*3/uL (ref 4.0–10.5)

## 2018-11-07 LAB — HCG, QUANTITATIVE, PREGNANCY: hCG, Beta Chain, Quant, S: 4469 m[IU]/mL — ABNORMAL HIGH (ref <5)

## 2018-11-07 LAB — POCT PREGNANCY, URINE: Preg Test, Ur: POSITIVE — AB

## 2018-11-07 NOTE — ED Triage Notes (Signed)
Pt came to Ed via pov. Reports went to health department a few days ago, was told she is [redacted] weeks pregnant. Previous ectopic pregnancy and reports feels similar.Spoting and cramping started two days ago.

## 2018-11-07 NOTE — ED Notes (Signed)
Pt called to room without answer.  

## 2018-11-07 NOTE — ED Notes (Signed)
Called to room without answer.  

## 2018-11-08 ENCOUNTER — Telehealth: Payer: Self-pay | Admitting: Emergency Medicine

## 2018-11-08 NOTE — Telephone Encounter (Signed)
Called patient due to lwot to inquire about condition and follow up plans.  Says she was unable to get appt with obgyn today. I explained that we cannot be sure she is not having ectopic, and that if she continues with pain she should return.

## 2018-11-13 ENCOUNTER — Encounter: Payer: Self-pay | Admitting: Emergency Medicine

## 2018-11-13 ENCOUNTER — Emergency Department
Admission: EM | Admit: 2018-11-13 | Discharge: 2018-11-14 | Disposition: A | Discharge status: Home / Self Care | Payer: Medicaid Other | Attending: Emergency Medicine | Admitting: Emergency Medicine

## 2018-11-13 ENCOUNTER — Emergency Department: Payer: Medicaid Other

## 2018-11-13 ENCOUNTER — Other Ambulatory Visit: Payer: Self-pay

## 2018-11-13 DIAGNOSIS — R1030 Lower abdominal pain, unspecified: Secondary | ICD-10-CM | POA: Diagnosis not present

## 2018-11-13 DIAGNOSIS — R51 Headache: Secondary | ICD-10-CM | POA: Diagnosis not present

## 2018-11-13 DIAGNOSIS — Z3A01 Less than 8 weeks gestation of pregnancy: Secondary | ICD-10-CM | POA: Insufficient documentation

## 2018-11-13 DIAGNOSIS — Y929 Unspecified place or not applicable: Secondary | ICD-10-CM | POA: Diagnosis not present

## 2018-11-13 DIAGNOSIS — Y999 Unspecified external cause status: Secondary | ICD-10-CM | POA: Insufficient documentation

## 2018-11-13 DIAGNOSIS — X58XXXA Exposure to other specified factors, initial encounter: Secondary | ICD-10-CM | POA: Diagnosis not present

## 2018-11-13 DIAGNOSIS — R102 Pelvic and perineal pain: Secondary | ICD-10-CM

## 2018-11-13 DIAGNOSIS — O9989 Other specified diseases and conditions complicating pregnancy, childbirth and the puerperium: Secondary | ICD-10-CM | POA: Diagnosis present

## 2018-11-13 DIAGNOSIS — S29012A Strain of muscle and tendon of back wall of thorax, initial encounter: Secondary | ICD-10-CM | POA: Diagnosis not present

## 2018-11-13 DIAGNOSIS — Y939 Activity, unspecified: Secondary | ICD-10-CM | POA: Insufficient documentation

## 2018-11-13 DIAGNOSIS — M542 Cervicalgia: Secondary | ICD-10-CM | POA: Insufficient documentation

## 2018-11-13 DIAGNOSIS — T148XXA Other injury of unspecified body region, initial encounter: Secondary | ICD-10-CM

## 2018-11-13 LAB — COMPREHENSIVE METABOLIC PANEL
ALBUMIN: 3.8 g/dL (ref 3.5–5.0)
ALT: 35 U/L (ref 0–44)
ANION GAP: 7 (ref 5–15)
AST: 28 U/L (ref 15–41)
Alkaline Phosphatase: 61 U/L (ref 38–126)
BILIRUBIN TOTAL: 0.3 mg/dL (ref 0.3–1.2)
BUN: 14 mg/dL (ref 6–20)
CHLORIDE: 104 mmol/L (ref 98–111)
CO2: 25 mmol/L (ref 22–32)
Calcium: 8.7 mg/dL — ABNORMAL LOW (ref 8.9–10.3)
Creatinine, Ser: 0.8 mg/dL (ref 0.44–1.00)
GFR calc Af Amer: >60 mL/min (ref >60)
GFR calc non Af Amer: >60 mL/min (ref >60)
GLUCOSE: 104 mg/dL — AB (ref 70–99)
Potassium: 3.7 mmol/L (ref 3.5–5.1)
Sodium: 136 mmol/L (ref 135–145)
TOTAL PROTEIN: 7.1 g/dL (ref 6.5–8.1)

## 2018-11-13 LAB — CBC
HEMATOCRIT: 36.7 % (ref 36.0–46.0)
HEMOGLOBIN: 12 g/dL (ref 12.0–15.0)
MCH: 28.5 pg (ref 26.0–34.0)
MCHC: 32.7 g/dL (ref 30.0–36.0)
MCV: 87.2 fL (ref 80.0–100.0)
Platelets: 232 10*3/uL (ref 150–400)
RBC: 4.21 MIL/uL (ref 3.87–5.11)
RDW: 13.4 % (ref 11.5–15.5)
WBC: 8.1 10*3/uL (ref 4.0–10.5)
nRBC: 0 % (ref 0.0–0.2)

## 2018-11-13 LAB — URINALYSIS, COMPLETE (UACMP) WITH MICROSCOPIC
Bacteria, UA: NONE SEEN
Bilirubin Urine: NEGATIVE
GLUCOSE, UA: NEGATIVE mg/dL
HGB URINE DIPSTICK: NEGATIVE
KETONES UR: NEGATIVE mg/dL
NITRITE: NEGATIVE
PROTEIN: NEGATIVE mg/dL
Specific Gravity, Urine: 1.026 (ref 1.005–1.030)
pH: 6 (ref 5.0–8.0)

## 2018-11-13 LAB — HCG, QUANTITATIVE, PREGNANCY: hCG, Beta Chain, Quant, S: 20878 m[IU]/mL — ABNORMAL HIGH (ref <5)

## 2018-11-13 LAB — POCT PREGNANCY, URINE: Preg Test, Ur: POSITIVE — AB

## 2018-11-13 MED ORDER — ACETAMINOPHEN 500 MG PO TABS
1000.0000 mg | ORAL_TABLET | Freq: Once | ORAL | Status: AC
  Administered 2018-11-13: 1000 mg via ORAL
  Filled 2018-11-13: qty 2

## 2018-11-13 MED ORDER — SODIUM CHLORIDE 0.9 % IV BOLUS
1000.0000 mL | Freq: Once | INTRAVENOUS | Status: AC
  Administered 2018-11-14: 1000 mL via INTRAVENOUS

## 2018-11-13 MED ORDER — METOCLOPRAMIDE HCL 5 MG/ML IJ SOLN
10.0000 mg | Freq: Once | INTRAMUSCULAR | Status: AC
  Administered 2018-11-13: 10 mg via INTRAVENOUS
  Filled 2018-11-13: qty 2

## 2018-11-13 MED ORDER — DIPHENHYDRAMINE HCL 50 MG/ML IJ SOLN
25.0000 mg | Freq: Once | INTRAMUSCULAR | Status: AC
  Administered 2018-11-13: 25 mg via INTRAVENOUS
  Filled 2018-11-13: qty 1

## 2018-11-13 NOTE — ED Triage Notes (Signed)
Pt presents to ED with mid back pain that radiates to her neck. Pt states she thinks her pain is "triggering a headache". Has been ongoing for several days. Pt also reports lower back pain and cramping in her lower abd for about a week. Found out she was [redacted] weeks pregnant 11/05/2018 at the health dept. Denies vaginal bleeding at this time. Does not go to her obgyn tomorrow.
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
## 2018-11-13 NOTE — ED Notes (Signed)
Patient off unit to u/s  

## 2018-11-14 ENCOUNTER — Ambulatory Visit (INDEPENDENT_AMBULATORY_CARE_PROVIDER_SITE_OTHER): Payer: Self-pay | Admitting: Advanced Practice Midwife

## 2018-11-14 ENCOUNTER — Other Ambulatory Visit (HOSPITAL_COMMUNITY)
Admission: RE | Admit: 2018-11-14 | Discharge: 2018-11-14 | Disposition: A | Discharge status: Home / Self Care | Payer: Medicaid Other | Source: Ambulatory Visit | Attending: Advanced Practice Midwife | Admitting: Advanced Practice Midwife

## 2018-11-14 ENCOUNTER — Encounter: Payer: Self-pay | Admitting: Advanced Practice Midwife

## 2018-11-14 VITALS — BP 122/74 | Ht 59.0 in | Wt 246.0 lb

## 2018-11-14 DIAGNOSIS — O0990 Supervision of high risk pregnancy, unspecified, unspecified trimester: Secondary | ICD-10-CM

## 2018-11-14 DIAGNOSIS — Z3A01 Less than 8 weeks gestation of pregnancy: Secondary | ICD-10-CM

## 2018-11-14 DIAGNOSIS — O99211 Obesity complicating pregnancy, first trimester: Secondary | ICD-10-CM

## 2018-11-14 DIAGNOSIS — Z113 Encounter for screening for infections with a predominantly sexual mode of transmission: Secondary | ICD-10-CM

## 2018-11-14 DIAGNOSIS — O99210 Obesity complicating pregnancy, unspecified trimester: Secondary | ICD-10-CM

## 2018-11-14 DIAGNOSIS — Z6841 Body Mass Index (BMI) 40.0 and over, adult: Secondary | ICD-10-CM

## 2018-11-14 MED ORDER — LIDOCAINE 5 % EX PTCH
1.0000 | MEDICATED_PATCH | Freq: Once | CUTANEOUS | Status: DC
  Administered 2018-11-14: 1 via TRANSDERMAL
  Filled 2018-11-14: qty 1

## 2018-11-14 MED ORDER — IBUPROFEN 600 MG PO TABS
600.0000 mg | ORAL_TABLET | Freq: Once | ORAL | Status: AC
  Administered 2018-11-14: 600 mg via ORAL
  Filled 2018-11-14: qty 1

## 2018-11-14 MED ORDER — LIDOCAINE 5 % EX PTCH: 1.0000 | MEDICATED_PATCH | Freq: Two times a day (BID) | CUTANEOUS | 0 refills | Status: DC

## 2018-11-14 MED ORDER — LIDOCAINE HCL (PF) 1 % IJ SOLN: 5.0000 mL | Freq: Once | INTRAMUSCULAR | Status: DC

## 2018-11-14 NOTE — Patient Instructions (Signed)
Exercise During Pregnancy For people of all ages, exercise is an important part of being healthy. Exercise improves heart and lung function and helps to maintain strength, flexibility, and a healthy body weight. Exercise also boosts energy levels and elevates mood. For most women, maintaining an exercise routine throughout pregnancy is recommended. It is only on rare occasions and with certain medical conditions or pregnancy complications that women may be asked to limit or avoid exercise during pregnancy. What are some other benefits to exercising during pregnancy? Along with maintaining strength and flexibility, exercising throughout pregnancy can help to:  Keep strength in muscles that are very important during labor and childbirth.  Decrease low back pain during pregnancy.  Decrease the risk of developing gestational diabetes mellitus (GDM).  Improve blood sugar (glucose) control for women who have GDM.  Decrease the risk of developing preeclampsia. This is a serious condition that causes high blood pressure along with other symptoms, such as swelling and headaches.  Decrease the risk of cesarean delivery.  Speed up the recovery after giving birth. How often should I exercise? Unless your health care provider gives you different instructions, you should try to exercise on most days or all days of the week. In general, try to exercise with moderate intensity for about 150 minutes per week. This can be spread out across several days, such as exercising for 30 minutes per day on 5 days of each week. You can tell that you are exercising at a moderate intensity if you have a higher heart rate and faster breathing, but you are still able to hold a conversation. What types of moderate-intensity exercise are recommended during pregnancy? There are many types of exercise that are safe for you to do during pregnancy. Unless your health care provider gives you different instructions, do a variety of  exercises that safely increase your heart and breathing (cardiopulmonary) rates and help you to build and maintain muscle strength (strength training). You should always be able to talk in full sentences while exercising during pregnancy. Some examples of exercising that is safe to do during pregnancy include:  Brisk walking or hiking.  Swimming.  Water aerobics.  Riding a stationary bike.  Strength training.  Modified yoga or Pilates. Tell your instructor that you are pregnant. Avoid overstretching and avoid lying on your back for long periods of time.  Running or jogging. Only choose this type of exercise if: ? You ran or jogged regularly before your pregnancy. ? You can run or jog and still talk in complete sentences. What types of exercise should I not do during pregnancy? Depending on your level of fitness and whether you exercised regularly before your pregnancy, you may be advised to limit vigorous-intensity exercise during your pregnancy. You can tell that you are exercising at a vigorous intensity if you are breathing much harder and faster and cannot hold a conversation while exercising. Some examples of exercising that you should avoid during pregnancy include:  Contact sports.  Activities that place you at risk for falling on or being hit in the belly, such as downhill skiing, water skiing, surfing, rock climbing, cycling, gymnastics, and horseback riding.  Scuba diving.  Sky diving.  Yoga or Pilates in a room that is heated to extreme temperatures ("hot yoga" or "hot Pilates").  Jogging or running, unless you ran or jogged regularly before your pregnancy. While jogging or running, you should always be able to talk in full sentences. Do not run or jog so vigorously that you   are unable to have a conversation.  If you are not used to exercising at elevation (more than 6,000 feet above sea level), do not do so during your pregnancy. When should I avoid exercising during  pregnancy? Certain medical conditions can make it unsafe to exercise during pregnancy, or they may increase your risk of miscarriage or early labor and birth. Some of these conditions include:  Some types of heart disease.  Some types of lung disease.  Placenta previa. This is when the placenta partially or completely covers the opening of the uterus (cervix).  Frequent bleeding from the vagina during your pregnancy.  Incompetent cervix. This is when your cervix does not remain as tightly closed during pregnancy as it should.  Premature labor.  Ruptured membranes. This is when the protective sac (amniotic sac) opens up and amniotic fluid leaks from your vagina.  Severely low blood count (anemia).  Preeclampsia or pregnancy-caused high blood pressure.  Carrying more than one baby (multiple gestation) and having an additional risk of early labor.  Poorly controlled diabetes.  Being severely underweight or severely overweight.  Intrauterine growth restriction. This is when your baby's growth and development during pregnancy are slower than expected.  Other medical conditions. Ask your health care provider if any apply to you. What else should I know about exercising during pregnancy? You should take these precautions while exercising during pregnancy:  Avoid overheating. ? Wear loose-fitting, breathable clothes. ? Do not exercise in very high temperatures.  Avoid dehydration. Drink enough water before, during, and after exercise to keep your urine clear or pale yellow.  Avoid overstretching. Because of hormone changes during pregnancy, it is easy to overstretch muscles, tendons, and ligaments during pregnancy.  Start slowly and ask your health care provider to recommend types of exercise that are safe for you, if exercising regularly is new for you. Pregnancy is not a time for exercising to lose weight. When should I seek medical care? You should stop exercising and call your  health care provider if you have any unusual symptoms, such as:  Mild uterine contractions or abdominal cramping.  Dizziness that does not improve with rest. When should I seek immediate medical care? You should stop exercising and call your local emergency services (911 in the [place] if you have any unusual symptoms, such as:  Sudden, severe pain in your low back or your belly.  Uterine contractions or abdominal cramping that do not improve with rest.  Chest pain.  Bleeding or fluid leaking from your vagina.  Shortness of breath. This information is not intended to replace advice given to you by your health care provider. Make sure you discuss any questions you have with your health care provider. Document Released: 10/16/2005 Document Revised: 03/15/2016 Document Reviewed: 12/24/2014 Elsevier Interactive Patient Education  2019 [employer] Eating Plan for Pregnant Women While you are pregnant, your body requires additional nutrition to help support your growing baby. You also have a higher need for some vitamins and minerals, such as folic acid, calcium, iron, and vitamin D. Eating a healthy, well-balanced diet is very important for your health and your baby's health. Your need for extra calories varies for the three 3-month segments of your pregnancy (trimesters). For most women, it is recommended to consume:  150 extra calories a day during the first trimester.  300 extra calories a day during the second trimester.  300 extra calories a day during the third trimester. What are tips for following this plan?   Do   not try to lose weight or go on a diet during pregnancy.  Limit your overall intake of foods that have "empty calories." These are foods that have little nutritional value, such as sweets, desserts, candies, and sugar-sweetened beverages.  Eat a variety of foods (especially fruits and vegetables) to get a full range of vitamins and minerals.  Take a prenatal vitamin  to help meet your additional vitamin and mineral needs during pregnancy, specifically for folic acid, iron, calcium, and vitamin D.  Remember to stay active. Ask your health care provider what types of exercise and activities are safe for you.  Practice good food safety and cleanliness. Wash your hands before you eat and after you prepare raw meat. Wash all fruits and vegetables well before peeling or eating. Taking these actions can help to prevent food-borne illnesses that can be very dangerous to your baby, such as listeriosis. Ask your health care provider for more information about listeriosis. What does 150 extra calories look like? Healthy options that provide 150 extra calories each day could be any of the following:  6-8 oz (170-230 g) of plain low-fat yogurt with  cup of berries.  1 apple with 2 teaspoons (11 g) of peanut butter.  Cut-up vegetables with  cup (60 g) of hummus.  8 oz (230 mL) or 1 cup of low-fat chocolate milk.  1 stick of string cheese with 1 medium orange.  1 peanut butter and jelly sandwich that is made with one slice of whole-wheat bread and 1 tsp (5 g) of peanut butter. For 300 extra calories, you could eat two of those healthy options each day. What is a healthy amount of weight to gain? The right amount of weight gain for you is based on your BMI before you became pregnant. If your BMI:  Was less than 18 (underweight), you should gain 28-40 lb (13-18 kg).  Was 18-24.9 (normal), you should gain 25-35 lb (11-16 kg).  Was 25-29.9 (overweight), you should gain 15-25 lb (7-11 kg).  Was 30 or greater (obese), you should gain 11-20 lb (5-9 kg). What if I am having twins or multiples? Generally, if you are carrying twins or multiples:  You may need to eat 300-600 extra calories a day.  The recommended range for total weight gain is 25-54 lb (11-25 kg), depending on your BMI before pregnancy.  Talk with your health care provider to find out about  nutritional needs, weight gain, and exercise that is right for you. What foods can I eat?  Grains All grains. Choose whole grains, such as whole-wheat bread, oatmeal, or brown rice. Vegetables All vegetables. Eat a variety of colors and types of vegetables. Remember to wash your vegetables well before peeling or eating. Fruits All fruits. Eat a variety of colors and types of fruit. Remember to wash your fruits well before peeling or eating. Meats and other protein foods Lean meats, including chicken, turkey, fish, and lean cuts of beef, veal, or pork. If you eat fish or seafood, choose options that are higher in omega-3 fatty acids and lower in mercury, such as salmon, herring, mussels, trout, sardines, pollock, shrimp, crab, and lobster. Tofu. Tempeh. Beans. Eggs. Peanut butter and other nut butters. Make sure that all meats, poultry, and eggs are cooked to food-safe temperatures or "well-done." Two or more servings of fish are recommended each week in order to get the most benefits from omega-3 fatty acids that are found in seafood. Choose fish that are lower in mercury. You can   find more information online:  www.fda.gov Dairy Pasteurized milk and milk alternatives (such as almond milk). Pasteurized yogurt and pasteurized cheese. Cottage cheese. Sour cream. Beverages Water. Juices that contain 100% fruit juice or vegetable juice. Caffeine-free teas and decaffeinated coffee. Drinks that contain caffeine are okay to drink, but it is better to avoid caffeine. Keep your total caffeine intake to less than 200 mg each day (which is 12 oz or 355 mL of coffee, tea, or soda) or the limit as told by your health care provider. Fats and oils Fats and oils are okay to include in moderation. Sweets and desserts Sweets and desserts are okay to include in moderation. Seasoning and other foods All pasteurized condiments. The items listed above may not be a complete list of recommended foods and beverages.  Contact your dietitian for more options. What foods are not recommended? Vegetables Raw (unpasteurized) vegetable juices. Fruits Unpasteurized fruit juices. Meats and other protein foods Lunch meats, bologna, hot dogs, or other deli meats. (If you must eat those meats, reheat them until they are steaming hot.) Refrigerated pat, meat spreads from a meat counter, smoked seafood that is found in the refrigerated section of a store. Raw or undercooked meats, poultry, and eggs. Raw fish, such as sushi or sashimi. Fish that have high mercury content, such as tilefish, shark, swordfish, and king mackerel. To learn more about mercury in fish, talk with your health care provider or look for online resources, such as:  www.fda.gov Dairy Raw (unpasteurized) milk and any foods that have raw milk in them. Soft cheeses, such as feta, queso blanco, queso fresco, Brie, Camembert cheeses, blue-veined cheeses, and Panela cheese (unless it is made with pasteurized milk, which must be stated on the label). Beverages Alcohol. Sugar-sweetened beverages, such as sodas, teas, or energy drinks. Seasoning and other foods Homemade fermented foods and drinks, such as pickles, sauerkraut, or kombucha drinks. (Store-bought pasteurized versions of these are okay.) Salads that are made in a store or deli, such as ham salad, chicken salad, egg salad, tuna salad, and seafood salad. The items listed above may not be a complete list of foods and beverages to avoid. Contact your dietitian for more information. Where to find more information To calculate the number of calories you need based on your height, weight, and activity level, you can use an online calculator such as:  www.choosemyplate.gov/MyPlatePlan To calculate how much weight you should gain during pregnancy, you can use an online pregnancy weight gain calculator such as:  www.choosemyplate.gov/pregnancy-weight-gain-calculator Summary  While you are pregnant,  your body requires additional nutrition to help support your growing baby.  Eat a variety of foods, especially fruits and vegetables to get a full range of vitamins and minerals.  Practice good food safety and cleanliness. Wash your hands before you eat and after you prepare raw meat. Wash all fruits and vegetables well before peeling or eating. Taking these actions can help to prevent food-borne illnesses, such as listeriosis, that can be very dangerous to your baby.  Do not eat raw meat or fish. Do not eat fish that have high mercury content, such as tilefish, shark, swordfish, and king mackerel. Do not eat unpasteurized (raw) dairy.  Take a prenatal vitamin to help meet your additional vitamin and mineral needs during pregnancy, specifically for folic acid, iron, calcium, and vitamin D. This information is not intended to replace advice given to you by your health care provider. Make sure you discuss any questions you have with your health care   provider. Document Released: 07/31/2014 Document Revised: 07/13/2017 Document Reviewed: 07/13/2017 Elsevier Interactive Patient Education  2019 [employer]. Prenatal Care Prenatal care is health care during pregnancy. It helps you and your unborn baby (fetus) stay as healthy as possible. Prenatal care may be provided by a midwife, a family practice health care provider, or a childbirth and pregnancy specialist (obstetrician). How does this affect me? During pregnancy, you will be closely monitored for any new conditions that might develop. To lower your risk of pregnancy complications, you and your health care provider will talk about any underlying conditions you have. How does this affect my baby? Early and consistent prenatal care increases the chance that your baby will be healthy during pregnancy. Prenatal care lowers the risk that your baby will be:  Born early (prematurely).  Smaller than expected at birth (small for gestational age). What  can I expect at the first prenatal care visit? Your first prenatal care visit will likely be the longest. You should schedule your first prenatal care visit as soon as you know that you are pregnant. Your first visit is a good time to talk about any questions or concerns you have about pregnancy. At your visit, you and your health care provider will talk about:  Your medical history, including: ? Any past pregnancies. ? Your family's medical history. ? The baby's father's medical history. ? Any long-term (chronic) health conditions you have and how you manage them. ? Any surgeries or procedures you have had. ? Any current over-the-counter or prescription medicines, herbs, or supplements you are taking.  Other factors that could pose a risk to your baby, including:  Your home setting and your stress levels, including: ? Exposure to abuse or violence. ? Household financial strain. ? Mental health conditions you have.  Your daily health habits, including diet and exercise. Your health care provider will also:  Measure your weight, height, and blood pressure.  Do a physical exam, including a pelvic and breast exam.  Perform blood tests and urine tests to check for: ? Urinary tract infection. ? Sexually transmitted infections (STIs). ? Low iron levels in your blood (anemia). ? Blood type and certain proteins on red blood cells (Rh antibodies). ? Infections and immunity to viruses, such as hepatitis B and rubella. ? HIV (human immunodeficiency virus).  Do an ultrasound to confirm your baby's growth and development and to help predict your estimated due date (EDD). This ultrasound is done with a probe that is inserted into the vagina (transvaginal ultrasound).  Discuss your options for genetic screening.  Give you information about how to keep yourself and your baby healthy, including: ? Nutrition and taking vitamins. ? Physical activity. ? How to manage pregnancy symptoms such as  nausea and vomiting (morning sickness). ? Infections and substances that may be harmful to your baby and how to avoid them. ? Food safety. ? Dental care. ? Working. ? Travel. ? Warning signs to watch for and when to call your health care provider. How often will I have prenatal care visits? After your first prenatal care visit, you will have regular visits throughout your pregnancy. The visit schedule is often as follows:  Up to week 28 of pregnancy: once every 4 weeks.  28-36 weeks: once every 2 weeks.  After 36 weeks: every week until delivery. Some women may have visits more or less often depending on any underlying health conditions and the health of the baby. Keep all follow-up and prenatal care visits as told by   your health care provider. This is important. What happens during routine prenatal care visits? Your health care provider will:  Measure your weight and blood pressure.  Check for fetal heart sounds.  Measure the height of your uterus in your abdomen (fundal height). This may be measured starting around week 20 of pregnancy.  Check the position of your baby inside your uterus.  Ask questions about your diet, sleeping patterns, and whether you can feel the baby move.  Review warning signs to watch for and signs of labor.  Ask about any pregnancy symptoms you are having and how you are dealing with them. Symptoms may include: ? Headaches. ? Nausea and vomiting. ? Vaginal discharge. ? Swelling. ? Fatigue. ? Constipation. ? Any discomfort, including back or pelvic pain. Make a list of questions to ask your health care provider at your routine visits. What tests might I have during prenatal care visits? You may have blood, urine, and imaging tests throughout your pregnancy, such as:  Urine tests to check for glucose, protein, or signs of infection.  Glucose tests to check for a form of diabetes that can develop during pregnancy (gestational diabetes mellitus).  This is usually done around week 24 of pregnancy.  An ultrasound to check your baby's growth and development and to check for birth defects. This is usually done around week 20 of pregnancy.  A test to check for group B strep (GBS) infection. This is usually done around week 36 of pregnancy.  Genetic testing. This may include blood or imaging tests, such as an ultrasound. Some genetic tests are done during the first trimester and some are done during the second trimester. What else can I expect during prenatal care visits? Your health care provider may recommend getting certain vaccines during pregnancy. These may include:  A yearly flu shot (annual influenza vaccine). This is especially important if you will be pregnant during flu season.  Tdap (tetanus, diphtheria, pertussis) vaccine. Getting this vaccine during pregnancy can protect your baby from whooping cough (pertussis) after birth. This vaccine may be recommended between weeks 27 and 36 of pregnancy. Later in your pregnancy, your health care provider may give you information about:  Childbirth and breastfeeding classes.  Choosing a health care provider for your baby.  Umbilical cord banking.  Breastfeeding.  Birth control after your baby is born.  The hospital labor and delivery unit and how to tour it.  Registering at the hospital before you go into labor. Where to find more information  Office on Women's Health: womenshealth.gov  American Pregnancy Association: americanpregnancy.org  March of Dimes: marchofdimes.org Summary  Prenatal care helps you and your baby stay as healthy as possible during pregnancy.  Your first prenatal care visit will most likely be the longest.  You will have visits and tests throughout your pregnancy to monitor your health and your baby's health.  Bring a list of questions to your visits to ask your health care provider.  Make sure to keep all follow-up and prenatal care visits with  your health care provider. This information is not intended to replace advice given to you by your health care provider. Make sure you discuss any questions you have with your health care provider. Document Released: 10/19/2003 Document Revised: 10/15/2017 Document Reviewed: 10/15/2017 Elsevier Interactive Patient Education  2019 [employer]  

## 2018-11-14 NOTE — Progress Notes (Signed)
NOB today. COP at [facility] 

## 2018-11-14 NOTE — Discharge Instructions (Signed)
Tylenol is safe for your entire pregnancy and while we try to avoid ibuprofen it is generally safe in the first and second trimesters.  Please use pain medication as needed for severe symptoms and follow-up with your [facility] gynecologist tomorrow as scheduled.  Return to the emergency department sooner for any concerns.  It was a pleasure to take care of you today, and thank you for coming to our emergency department.  If you have any questions or concerns before leaving please ask the nurse to grab me and I'm more than happy to go through your aftercare instructions again.  If you were prescribed any opioid pain medication today such as Norco, Vicodin, Percocet, morphine, hydrocodone, or oxycodone please make sure you do not drive when you are taking this medication as it can alter your ability to drive safely.  If you have any concerns once you are home that you are not improving or are in fact getting worse before you can make it to your follow-up appointment, please do not hesitate to call 911 and come back for further evaluation.  Merrily Brittle, MD  Results for orders placed or performed during the hospital encounter of 11/13/18  Comprehensive metabolic panel  Result Value Ref Range   Sodium 136 135 - 145 mmol/L   Potassium 3.7 3.5 - 5.1 mmol/L   Chloride 104 98 - 111 mmol/L   CO2 25 22 - 32 mmol/L   Glucose, Bld 104 (H) 70 - 99 mg/dL   BUN 14 6 - 20 mg/dL   Creatinine, Ser 7.61 0.44 - 1.00 mg/dL   Calcium 8.7 (L) 8.9 - 10.3 mg/dL   Total Protein 7.1 6.5 - 8.1 g/dL   Albumin 3.8 3.5 - 5.0 g/dL   AST 28 15 - 41 U/L   ALT 35 0 - 44 U/L   Alkaline Phosphatase 61 38 - 126 U/L   Total Bilirubin 0.3 0.3 - 1.2 mg/dL   GFR calc non Af Amer >60 >60 mL/min   GFR calc Af Amer >60 >60 mL/min   Anion gap 7 5 - 15  CBC  Result Value Ref Range   WBC 8.1 4.0 - 10.5 K/uL   RBC 4.21 3.87 - 5.11 MIL/uL   Hemoglobin 12.0 12.0 - 15.0 g/dL   HCT 47.0 92.9 - 57.4 %   MCV 87.2 80.0 - 100.0 fL   MCH  28.5 26.0 - 34.0 pg   MCHC 32.7 30.0 - 36.0 g/dL   RDW 73.4 03.7 - 09.6 %   Platelets 232 150 - 400 K/uL   nRBC 0.0 0.0 - 0.2 %  Urinalysis, Complete w Microscopic  Result Value Ref Range   Color, Urine YELLOW (A) YELLOW   APPearance CLEAR (A) CLEAR   Specific Gravity, Urine 1.026 1.005 - 1.030   pH 6.0 5.0 - 8.0   Glucose, UA NEGATIVE NEGATIVE mg/dL   Hgb urine dipstick NEGATIVE NEGATIVE   Bilirubin Urine NEGATIVE NEGATIVE   Ketones, ur NEGATIVE NEGATIVE mg/dL   Protein, ur NEGATIVE NEGATIVE mg/dL   Nitrite NEGATIVE NEGATIVE   Leukocytes, UA SMALL (A) NEGATIVE   RBC / HPF 6-10 0 - 5 RBC/hpf   WBC, UA 0-5 0 - 5 WBC/hpf   Bacteria, UA NONE SEEN NONE SEEN   Squamous Epithelial / LPF 6-10 0 - 5   Mucus PRESENT   hCG, quantitative, pregnancy  Result Value Ref Range   hCG, Beta Chain, Quant, S 20,878 (H) <5 mIU/mL  Pregnancy, urine POC  Result Value  Ref Range   Preg Test, Ur POSITIVE (A) NEGATIVE  ABO/Rh  Result Value Ref Range   ABO/RH(D)      O POS Performed at [facility], [address] [place], [place] [id number]    [place] Ob Comp < 14 Wks  Result Date: 11/14/2018 CLINICAL DATA:  Lower abdominal cramping and lower back pain for 1 week. Estimated gestational age by LMP is 5 weeks 3 days. Quantitative beta HCG is 20,878, rising from 4,469 6 days ago. EXAM: OBSTETRIC <14 WK US AND TRANSVAGINAL OB US TECHNIQUE: Both transabdominal and transvaginal ultrasound examinations were performed for complete evaluation of the gestation as well as the maternal uterus, adnexal regions, and pelvic cul-de-sac. Transvaginal technique was performed to assess early pregnancy. COMPARISON:  None. FINDINGS: Intrauterine gestational sac: A single intrauterine gestational sac is present. The gestational sac is positioned somewhat low in the endometrial cavity but appears regular and shape. Yolk sac:  Yolk sac is present. Embryo:  Fetal pole is present. Cardiac Activity: Fetal cardiac activity is  observed. Heart Rate: 112 bpm CRL: 4.3 mm   6 w   1 d                  US EDC: 07/08/2019 Subchorionic hemorrhage:  None visualized. Maternal uterus/adnexae: Uterus is anteverted. The posterior myometrium is somewhat heterogeneous with mild lobular contour suggesting fibroid. Cervix is unremarkable. Both ovaries are visualized and appear normal. Corpus luteal cyst on the left. Small amount of free fluid in the pelvis. IMPRESSION: Single injury in gestational sac is present. Gestational sac is positioned somewhat low in the endometrial cavity but appears otherwise normal. Follow-up as clinically indicated. The living embryo is demonstrated with cardiac activity observed. Estimated gestational age by crown-rump length is 6 weeks 1 day. Probable uterine fibroid. Electronically Signed   By: Burman NievesWilliam  Stevens M.D.   On: 11/14/2018 01:12   [place] Ob Transvaginal  Result Date: 11/14/2018 CLINICAL DATA:  Lower abdominal cramping and lower back pain for 1 week. Estimated gestational age by LMP is 5 weeks 3 days. Quantitative beta HCG is 20,878, rising from 4,469 6 days ago. EXAM: OBSTETRIC <14 WK US AND TRANSVAGINAL OB US TECHNIQUE: Both transabdominal and transvaginal ultrasound examinations were performed for complete evaluation of the gestation as well as the maternal uterus, adnexal regions, and pelvic cul-de-sac. Transvaginal technique was performed to assess early pregnancy. COMPARISON:  None. FINDINGS: Intrauterine gestational sac: A single intrauterine gestational sac is present. The gestational sac is positioned somewhat low in the endometrial cavity but appears regular and shape. Yolk sac:  Yolk sac is present. Embryo:  Fetal pole is present. Cardiac Activity: Fetal cardiac activity is observed. Heart Rate: 112 bpm CRL: 4.3 mm   6 w   1 d                  US EDC: 07/08/2019 Subchorionic hemorrhage:  None visualized. Maternal uterus/adnexae: Uterus is anteverted. The posterior myometrium is somewhat heterogeneous  with mild lobular contour suggesting fibroid. Cervix is unremarkable. Both ovaries are visualized and appear normal. Corpus luteal cyst on the left. Small amount of free fluid in the pelvis. IMPRESSION: Single injury in gestational sac is present. Gestational sac is positioned somewhat low in the endometrial cavity but appears otherwise normal. Follow-up as clinically indicated. The living embryo is demonstrated with cardiac activity observed. Estimated gestational age by crown-rump length is 6 weeks 1 day. Probable uterine fibroid. Electronically Signed   By: Marisa CyphersWilliam  Stevens M.D.  On: 11/14/2018 01:12

## 2018-11-15 LAB — CERVICOVAGINAL ANCILLARY ONLY
Chlamydia: NEGATIVE
NEISSERIA GONORRHEA: NEGATIVE
Trichomonas: NEGATIVE
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
## 2018-11-16 LAB — URINE CULTURE: ORGANISM ID, BACTERIA: NO GROWTH

## 2018-11-23 LAB — ABO/RH: ABO/RH(D): O POS

## 2018-11-28 ENCOUNTER — Encounter: Payer: Medicaid Other | Admitting: Advanced Practice Midwife

## 2018-11-28 ENCOUNTER — Ambulatory Visit: Payer: Medicaid Other

## 2019-03-06 ENCOUNTER — Other Ambulatory Visit: Payer: Self-pay

## 2019-03-06 ENCOUNTER — Encounter: Payer: Self-pay | Admitting: Emergency Medicine

## 2019-03-06 ENCOUNTER — Emergency Department
Admission: EM | Admit: 2019-03-06 | Discharge: 2019-03-06 | Disposition: A | Discharge status: Home / Self Care | Payer: Medicaid Other | Attending: Emergency Medicine | Admitting: Emergency Medicine

## 2019-03-06 ENCOUNTER — Emergency Department: Payer: Medicaid Other

## 2019-03-06 DIAGNOSIS — R0789 Other chest pain: Secondary | ICD-10-CM | POA: Diagnosis present

## 2019-03-06 DIAGNOSIS — K219 Gastro-esophageal reflux disease without esophagitis: Secondary | ICD-10-CM | POA: Insufficient documentation

## 2019-03-06 LAB — CBC
HCT: 38.9 % (ref 36.0–46.0)
Hemoglobin: 12.9 g/dL (ref 12.0–15.0)
MCH: 29.1 pg (ref 26.0–34.0)
MCHC: 33.2 g/dL (ref 30.0–36.0)
MCV: 87.6 fL (ref 80.0–100.0)
Platelets: 266 10*3/uL (ref 150–400)
RBC: 4.44 MIL/uL (ref 3.87–5.11)
RDW: 13.1 % (ref 11.5–15.5)
WBC: 6.5 10*3/uL (ref 4.0–10.5)
nRBC: 0 % (ref 0.0–0.2)

## 2019-03-06 LAB — BASIC METABOLIC PANEL
Anion gap: 9 (ref 5–15)
BUN: 11 mg/dL (ref 6–20)
CO2: 23 mmol/L (ref 22–32)
Calcium: 8.7 mg/dL — ABNORMAL LOW (ref 8.9–10.3)
Chloride: 105 mmol/L (ref 98–111)
Creatinine, Ser: 0.7 mg/dL (ref 0.44–1.00)
GFR calc Af Amer: >60 mL/min (ref >60)
GFR calc non Af Amer: >60 mL/min (ref >60)
Glucose, Bld: 98 mg/dL (ref 70–99)
Potassium: 3.7 mmol/L (ref 3.5–5.1)
Sodium: 137 mmol/L (ref 135–145)

## 2019-03-06 LAB — TROPONIN I: Troponin I: <0.03 ng/mL (ref <0.03)

## 2019-03-06 LAB — POCT PREGNANCY, URINE: Preg Test, Ur: NEGATIVE

## 2019-03-06 MED ORDER — ALUM & MAG HYDROXIDE-SIMETH 200-200-20 MG/5ML PO SUSP
30.0000 mL | Freq: Once | ORAL | Status: AC
  Administered 2019-03-06: 30 mL via ORAL
  Filled 2019-03-06: qty 30

## 2019-03-06 MED ORDER — LIDOCAINE VISCOUS HCL 2 % MT SOLN
15.0000 mL | Freq: Once | OROMUCOSAL | Status: AC
  Administered 2019-03-06: 15 mL via ORAL
  Filled 2019-03-06: qty 15

## 2019-03-06 MED ORDER — SODIUM CHLORIDE 0.9% FLUSH: 3.0000 mL | Freq: Once | INTRAVENOUS | Status: DC

## 2019-03-06 MED ORDER — OMEPRAZOLE 10 MG PO CPDR: 10.0000 mg | DELAYED_RELEASE_CAPSULE | Freq: Every day | ORAL | 0 refills | Status: DC

## 2019-03-06 NOTE — ED Triage Notes (Signed)
Pt reports sore throat and chest pain for 2 days. Pt denies fevers, cough, or other sx's.
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
## 2019-05-02 ENCOUNTER — Encounter: Payer: Self-pay | Admitting: Emergency Medicine

## 2019-05-02 ENCOUNTER — Ambulatory Visit
Admission: EM | Admit: 2019-05-02 | Discharge: 2019-05-02 | Disposition: A | Discharge status: Home / Self Care | Payer: Medicaid Other | Attending: Family Medicine | Admitting: Family Medicine

## 2019-05-02 ENCOUNTER — Telehealth: Payer: Self-pay

## 2019-05-02 ENCOUNTER — Other Ambulatory Visit: Payer: Self-pay

## 2019-05-02 DIAGNOSIS — Z20822 Contact with and (suspected) exposure to covid-19: Secondary | ICD-10-CM

## 2019-05-02 DIAGNOSIS — Z20828 Contact with and (suspected) exposure to other viral communicable diseases: Secondary | ICD-10-CM

## 2019-05-02 DIAGNOSIS — B349 Viral infection, unspecified: Secondary | ICD-10-CM

## 2019-05-02 NOTE — ED Triage Notes (Signed)
Patient c/o SOB, bodyaches and HAs for a week.  Patient denies fevers.

## 2019-05-02 NOTE — Telephone Encounter (Signed)
-----   Message from Norval Gable, MD sent at 05/02/2019  2:41 PM EDT ----- Regarding: covid testing needed symptoms

## 2019-05-02 NOTE — Telephone Encounter (Signed)
Called pt. To schedule appt. For COVID testing.  Reported she is in UC at [facility] at the present time.  Offered an appt. For COVID testing on 7/7 @ 8:45 AM, at the [facility].  Advised to wear a mask and to remain in car for the testing. Advised if she gets tested at Urgent Care today, she can call on Monday, and cancel her appt.  Verb. Understanding.
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
## 2019-05-02 NOTE — Discharge Instructions (Signed)
Rest, fluids, tylenol Self quarantine pending test result

## 2019-05-05 ENCOUNTER — Other Ambulatory Visit: Payer: Self-pay

## 2019-05-05 DIAGNOSIS — Z20822 Contact with and (suspected) exposure to covid-19: Secondary | ICD-10-CM

## 2019-05-06 ENCOUNTER — Other Ambulatory Visit: Payer: Medicaid Other

## 2019-05-10 LAB — NOVEL CORONAVIRUS, NAA: SARS-CoV-2, NAA: NOT DETECTED

## 2019-09-07 ENCOUNTER — Other Ambulatory Visit: Payer: Self-pay

## 2019-09-07 ENCOUNTER — Emergency Department
Admission: EM | Admit: 2019-09-07 | Discharge: 2019-09-07 | Disposition: A | Discharge status: Home / Self Care | Payer: 59 | Attending: Student | Admitting: Student

## 2019-09-07 DIAGNOSIS — N3090 Cystitis, unspecified without hematuria: Secondary | ICD-10-CM

## 2019-09-07 DIAGNOSIS — Z975 Presence of (intrauterine) contraceptive device: Secondary | ICD-10-CM | POA: Diagnosis not present

## 2019-09-07 DIAGNOSIS — Z202 Contact with and (suspected) exposure to infections with a predominantly sexual mode of transmission: Secondary | ICD-10-CM | POA: Diagnosis not present

## 2019-09-07 DIAGNOSIS — R109 Unspecified abdominal pain: Secondary | ICD-10-CM | POA: Diagnosis present

## 2019-09-07 DIAGNOSIS — Z79899 Other long term (current) drug therapy: Secondary | ICD-10-CM | POA: Insufficient documentation

## 2019-09-07 DIAGNOSIS — N3000 Acute cystitis without hematuria: Secondary | ICD-10-CM | POA: Insufficient documentation

## 2019-09-07 LAB — URINALYSIS, COMPLETE (UACMP) WITH MICROSCOPIC
Bacteria, UA: NONE SEEN
Bilirubin Urine: NEGATIVE
Glucose, UA: NEGATIVE mg/dL
Ketones, ur: NEGATIVE mg/dL
Nitrite: NEGATIVE
Protein, ur: NEGATIVE mg/dL
Specific Gravity, Urine: 1.017 (ref 1.005–1.030)
pH: 6 (ref 5.0–8.0)

## 2019-09-07 LAB — WET PREP, GENITAL
Clue Cells Wet Prep HPF POC: NONE SEEN
Sperm: NONE SEEN
Trich, Wet Prep: NONE SEEN
Yeast Wet Prep HPF POC: NONE SEEN

## 2019-09-07 LAB — CHLAMYDIA/NGC RT PCR (ARMC ONLY)
Chlamydia Tr: NOT DETECTED
N gonorrhoeae: NOT DETECTED

## 2019-09-07 LAB — POCT PREGNANCY, URINE: Preg Test, Ur: NEGATIVE

## 2019-09-07 MED ORDER — CEPHALEXIN 500 MG PO CAPS: 500.0000 mg | ORAL_CAPSULE | Freq: Two times a day (BID) | ORAL | 0 refills | Status: AC

## 2019-09-07 NOTE — ED Notes (Signed)
Pt c/o lower abd pain and lower back pain x1-2 weeks - denies pain, burning, frequency with urination - pt has "burning spot on lip" - pt has been exposed to herpes and wants to be tested

## 2019-09-07 NOTE — ED Triage Notes (Signed)
Pt Is concerned about being exposed to STD, states she was told her exboyfriend has herpes. Pt c/o lower abd and back pain. Denies any discharge, denies painful urination.
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
## 2019-11-03 DIAGNOSIS — Z8759 Personal history of other complications of pregnancy, childbirth and the puerperium: Secondary | ICD-10-CM

## 2019-11-04 ENCOUNTER — Ambulatory Visit: Payer: BC Managed Care – PPO | Admitting: Family Medicine

## 2019-11-04 ENCOUNTER — Other Ambulatory Visit: Payer: Self-pay

## 2019-11-04 ENCOUNTER — Encounter: Payer: Self-pay | Admitting: Family Medicine

## 2019-11-04 DIAGNOSIS — Z113 Encounter for screening for infections with a predominantly sexual mode of transmission: Secondary | ICD-10-CM

## 2019-11-04 LAB — WET PREP FOR TRICH, YEAST, CLUE
Trichomonas Exam: NEGATIVE
Yeast Exam: NEGATIVE

## 2019-11-04 NOTE — Progress Notes (Signed)
In for STD screening; declines HIV/RPR testing Sharlette Dense, RN

## 2019-11-04 NOTE — Progress Notes (Signed)
[facility] Department STI clinic/screening visit  Subjective:  Miranda Fields is a 29 y.o. female being seen today for  Chief Complaint  Patient presents with  . SEXUALLY TRANSMITTED DISEASE     The patient reports they do not have symptoms. Patient reports that they do not desire a pregnancy in the next year. They reported they are not interested in discussing contraception today.   Patient has the following medical conditions:   Patient Active Problem List   Diagnosis Date Noted  . Supervision of high risk pregnancy, antepartum 11/14/2018  . History of pre-eclampsia 12/17/2017    HPI  Pt reports here for STI testing. Denies symptoms. Uses condoms sometimes.   See flowsheet for further details and programmatic requirements.    Patient's last menstrual period was 10/28/2019 (exact date). Last sex: 10/31/19 BCM: mirena IUD Desires EC? n/a  No components found for: HCV  The following portions of the patient's history were reviewed and updated as appropriate: allergies, current medications, past medical history, past social history, past surgical history and problem list.  Objective:  There were no vitals filed for this visit.   Physical Exam Vitals and nursing note reviewed.  Constitutional:      Appearance: Normal appearance.  HENT:     Head: Normocephalic and atraumatic.     Mouth/Throat:     Mouth: Mucous membranes are moist.     Pharynx: Oropharynx is clear. No oropharyngeal exudate or posterior oropharyngeal erythema.  Pulmonary:     Effort: Pulmonary effort is normal.  Abdominal:     General: Abdomen is flat.     Palpations: There is no mass.     Tenderness: There is no abdominal tenderness. There is no rebound.  Genitourinary:    General: Normal vulva.     Exam position: Lithotomy position.     Pubic Area: No rash or pubic lice.      Labia:        Right: No rash or lesion.        Left: No rash or lesion.      Vagina: Vaginal discharge  (minimal, white, ph>4.5) present. No erythema, bleeding or lesions.     Cervix: No cervical motion tenderness, discharge, friability, lesion or erythema.     Uterus: Normal.      Adnexa: Right adnexa normal and left adnexa normal.     Rectum: Normal.  Lymphadenopathy:     Head:     Right side of head: No preauricular or posterior auricular adenopathy.     Left side of head: No preauricular or posterior auricular adenopathy.     Cervical: No cervical adenopathy.     Upper Body:     Right upper body: No supraclavicular or axillary adenopathy.     Left upper body: No supraclavicular or axillary adenopathy.     Lower Body: No right inguinal adenopathy. No left inguinal adenopathy.  Skin:    General: Skin is warm and dry.     Findings: No rash.  Neurological:     Mental Status: She is alert and oriented to person, place, and time.      Assessment and Plan:  Miranda Fields is a 29 y.o. female presenting to the [facility] Department for STI screening    1. Screening examination for venereal disease -Screenings today as below. Treat wet prep per standing order. -Patient does meet criteria for HepB, HepC Screening. Declines these screenings. Also declines HIV and syphilis screenings. -Counseled on warning  s/sx and when to seek care. Recommended condom use with all sex and discussed importance of condom use for STI prevention. - WET PREP FOR [place], [facility], [facility]     Return for screening as needed.  No future appointments.  Kandee Keen, PA-C

## 2019-12-15 ENCOUNTER — Encounter: Payer: Self-pay | Admitting: Emergency Medicine

## 2019-12-15 ENCOUNTER — Emergency Department
Admission: EM | Admit: 2019-12-15 | Discharge: 2019-12-15 | Disposition: A | Discharge status: Home / Self Care | Payer: Medicaid Other | Attending: Emergency Medicine | Admitting: Emergency Medicine

## 2019-12-15 ENCOUNTER — Other Ambulatory Visit: Payer: Self-pay

## 2019-12-15 DIAGNOSIS — R519 Headache, unspecified: Secondary | ICD-10-CM | POA: Diagnosis present

## 2019-12-15 DIAGNOSIS — Z975 Presence of (intrauterine) contraceptive device: Secondary | ICD-10-CM | POA: Insufficient documentation

## 2019-12-15 DIAGNOSIS — Z20822 Contact with and (suspected) exposure to covid-19: Secondary | ICD-10-CM | POA: Insufficient documentation

## 2019-12-15 MED ORDER — BUTALBITAL-APAP-CAFFEINE 50-325-40 MG PO TABS: ORAL_TABLET | ORAL | 0 refills | Status: DC

## 2019-12-15 NOTE — Discharge Instructions (Signed)
Please quarantine at home until your test results are back.  If negative, and your symptoms have resolved he may return to work.  If positive, you will need to remain quarantined for 10 days from the onset of symptoms.  Take the Fioricet for headache.  You may also take Advil or ibuprofen.  Return to the emergency department for symptoms of change or worsen if you are unable to schedule an appointment with your primary care provider.

## 2019-12-15 NOTE — ED Triage Notes (Signed)
Presents with h/a for about 1 week  Body aches  States she has had fever at home  But is afebrile on arrival  States she has been recently exposed to COVID
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
## 2019-12-16 LAB — SARS CORONAVIRUS 2 (TAT 6-24 HRS): SARS Coronavirus 2: NEGATIVE

## 2020-01-25 ENCOUNTER — Ambulatory Visit: Payer: 59 | Attending: Internal Medicine

## 2020-01-25 DIAGNOSIS — Z23 Encounter for immunization: Secondary | ICD-10-CM

## 2020-01-25 NOTE — Progress Notes (Signed)
   Covid-19 Vaccination Clinic  Name:  Miranda Fields    MRN: [id number] DOB: [birth year]  01/25/2020  [name] was observed post Covid-19 immunization for 15 minutes without incident. She was provided with Vaccine Information Sheet and instruction to access the V-Safe system.   [name] was instructed to call 911 with any severe reactions post vaccine: Marland Kitchen Difficulty breathing  . Swelling of face and throat  . A fast heartbeat  . A bad rash all over body  . Dizziness and weakness   Immunizations Administered    Name Date Dose VIS Date Route   Pfizer COVID-19 Vaccine 01/25/2020 12:07 PM 0.3 mL 10/10/2019 Intramuscular   Manufacturer: [employer], [employer]   Lot: [id number]   NDC: [address]-7

## 2020-01-26 ENCOUNTER — Other Ambulatory Visit: Payer: Self-pay

## 2020-01-27 ENCOUNTER — Ambulatory Visit: Payer: 59 | Admitting: Obstetrics and Gynecology

## 2020-01-27 DIAGNOSIS — Z0289 Encounter for other administrative examinations: Secondary | ICD-10-CM

## 2020-02-15 ENCOUNTER — Ambulatory Visit: Payer: 59

## 2020-02-16 ENCOUNTER — Ambulatory Visit: Payer: 59 | Attending: Internal Medicine

## 2020-02-16 DIAGNOSIS — Z23 Encounter for immunization: Secondary | ICD-10-CM

## 2020-02-16 NOTE — Progress Notes (Signed)
   Covid-19 Vaccination Clinic  Name:  ETTY ISAAC    MRN: [id number] DOB: [birth year]  02/16/2020  Ms. Traister was observed post Covid-19 immunization for 15 minutes without incident. She was provided with Vaccine Information Sheet and instruction to access the V-Safe system.   Ms. Mcpherson was instructed to call 911 with any severe reactions post vaccine: Marland Kitchen Difficulty breathing  . Swelling of face and throat  . A fast heartbeat  . A bad rash all over body  . Dizziness and weakness   Immunizations Administered    Name Date Dose VIS Date Route   Pfizer COVID-19 Vaccine 02/16/2020  8:58 AM 0.3 mL 12/24/2018 Intramuscular   Manufacturer: [employer], [employer]   Lot: [id number]   NDC: [address]-8

## 2020-03-10 ENCOUNTER — Other Ambulatory Visit: Payer: Self-pay

## 2020-03-10 ENCOUNTER — Encounter: Payer: Self-pay | Admitting: Emergency Medicine

## 2020-03-10 DIAGNOSIS — R102 Pelvic and perineal pain: Secondary | ICD-10-CM | POA: Diagnosis not present

## 2020-03-10 DIAGNOSIS — Z79899 Other long term (current) drug therapy: Secondary | ICD-10-CM | POA: Diagnosis not present

## 2020-03-10 LAB — CBC WITH DIFFERENTIAL/PLATELET
Abs Immature Granulocytes: 0.02 10*3/uL (ref 0.00–0.07)
Basophils Absolute: 0 10*3/uL (ref 0.0–0.1)
Basophils Relative: 0 %
Eosinophils Absolute: 0.1 10*3/uL (ref 0.0–0.5)
Eosinophils Relative: 2 %
HCT: 37.4 % (ref 36.0–46.0)
Hemoglobin: 12.8 g/dL (ref 12.0–15.0)
Immature Granulocytes: 0 %
Lymphocytes Relative: 36 %
Lymphs Abs: 2.7 10*3/uL (ref 0.7–4.0)
MCH: 29.4 pg (ref 26.0–34.0)
MCHC: 34.2 g/dL (ref 30.0–36.0)
MCV: 86 fL (ref 80.0–100.0)
Monocytes Absolute: 0.5 10*3/uL (ref 0.1–1.0)
Monocytes Relative: 6 %
Neutro Abs: 4.2 10*3/uL (ref 1.7–7.7)
Neutrophils Relative %: 56 %
Platelets: 265 10*3/uL (ref 150–400)
RBC: 4.35 MIL/uL (ref 3.87–5.11)
RDW: 12.8 % (ref 11.5–15.5)
WBC: 7.6 10*3/uL (ref 4.0–10.5)
nRBC: 0 % (ref 0.0–0.2)

## 2020-03-10 LAB — URINALYSIS, COMPLETE (UACMP) WITH MICROSCOPIC
Bacteria, UA: NONE SEEN
Bilirubin Urine: NEGATIVE
Glucose, UA: NEGATIVE mg/dL
Ketones, ur: NEGATIVE mg/dL
Leukocytes,Ua: NEGATIVE
Nitrite: NEGATIVE
Protein, ur: NEGATIVE mg/dL
Specific Gravity, Urine: 1.018 (ref 1.005–1.030)
pH: 6 (ref 5.0–8.0)

## 2020-03-10 LAB — COMPREHENSIVE METABOLIC PANEL
ALT: 19 U/L (ref 0–44)
AST: 19 U/L (ref 15–41)
Albumin: 3.6 g/dL (ref 3.5–5.0)
Alkaline Phosphatase: 76 U/L (ref 38–126)
Anion gap: 7 (ref 5–15)
BUN: 15 mg/dL (ref 6–20)
CO2: 25 mmol/L (ref 22–32)
Calcium: 9 mg/dL (ref 8.9–10.3)
Chloride: 106 mmol/L (ref 98–111)
Creatinine, Ser: 0.83 mg/dL (ref 0.44–1.00)
GFR calc Af Amer: >60 mL/min (ref >60)
GFR calc non Af Amer: >60 mL/min (ref >60)
Glucose, Bld: 109 mg/dL — ABNORMAL HIGH (ref 70–99)
Potassium: 3.7 mmol/L (ref 3.5–5.1)
Sodium: 138 mmol/L (ref 135–145)
Total Bilirubin: 0.4 mg/dL (ref 0.3–1.2)
Total Protein: 7 g/dL (ref 6.5–8.1)

## 2020-03-10 LAB — LIPASE, BLOOD: Lipase: 27 U/L (ref 11–51)

## 2020-03-10 LAB — POCT PREGNANCY, URINE: Preg Test, Ur: NEGATIVE

## 2020-03-10 NOTE — ED Triage Notes (Signed)
Patient ambulatory to triage with steady gait, without difficulty or distress noted, mask in place; pt reports x wk having lower abd & back pain with no accomp symptoms

## 2020-03-11 ENCOUNTER — Emergency Department: Payer: BC Managed Care – PPO

## 2020-03-11 ENCOUNTER — Emergency Department
Admission: EM | Admit: 2020-03-11 | Discharge: 2020-03-11 | Disposition: A | Discharge status: Home / Self Care | Payer: BC Managed Care – PPO | Attending: Emergency Medicine | Admitting: Emergency Medicine

## 2020-03-11 DIAGNOSIS — R102 Pelvic and perineal pain: Secondary | ICD-10-CM
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
## 2020-03-16 ENCOUNTER — Other Ambulatory Visit (HOSPITAL_COMMUNITY)
Admission: RE | Admit: 2020-03-16 | Discharge: 2020-03-16 | Disposition: A | Discharge status: Home / Self Care | Payer: BC Managed Care – PPO | Source: Ambulatory Visit | Attending: Obstetrics and Gynecology | Admitting: Obstetrics and Gynecology

## 2020-03-16 ENCOUNTER — Other Ambulatory Visit: Payer: Self-pay

## 2020-03-16 ENCOUNTER — Ambulatory Visit (INDEPENDENT_AMBULATORY_CARE_PROVIDER_SITE_OTHER): Payer: BC Managed Care – PPO | Admitting: Obstetrics and Gynecology

## 2020-03-16 ENCOUNTER — Encounter: Payer: Self-pay | Admitting: Obstetrics and Gynecology

## 2020-03-16 VITALS — BP 126/74 | Wt 254.0 lb

## 2020-03-16 DIAGNOSIS — Z975 Presence of (intrauterine) contraceptive device: Secondary | ICD-10-CM | POA: Diagnosis not present

## 2020-03-16 DIAGNOSIS — A749 Chlamydial infection, unspecified: Secondary | ICD-10-CM | POA: Diagnosis not present

## 2020-03-16 DIAGNOSIS — R102 Pelvic and perineal pain: Secondary | ICD-10-CM | POA: Diagnosis present
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
## 2020-03-17 LAB — CYTOLOGY - PAP
Comment: NEGATIVE
Diagnosis: NEGATIVE
Diagnosis: REACTIVE
High risk HPV: NEGATIVE

## 2020-03-17 LAB — CERVICOVAGINAL ANCILLARY ONLY
Chlamydia: POSITIVE — AB
Comment: NEGATIVE
Comment: NEGATIVE
Comment: NORMAL
Neisseria Gonorrhea: NEGATIVE
Trichomonas: NEGATIVE

## 2020-03-18 ENCOUNTER — Other Ambulatory Visit: Payer: Self-pay | Admitting: Obstetrics and Gynecology

## 2020-03-18 ENCOUNTER — Telehealth: Payer: Self-pay | Admitting: Obstetrics and Gynecology

## 2020-03-18 DIAGNOSIS — A749 Chlamydial infection, unspecified: Secondary | ICD-10-CM

## 2020-03-18 LAB — URINE CULTURE: Organism ID, Bacteria: NO GROWTH

## 2020-03-18 MED ORDER — AZITHROMYCIN 500 MG PO TABS: 1000.0000 mg | ORAL_TABLET | Freq: Once | ORAL | 0 refills | Status: AC

## 2020-03-18 NOTE — Telephone Encounter (Signed)
Pt aware and pt partner is being treated as well.

## 2020-03-18 NOTE — Telephone Encounter (Signed)
Would you mind calling the patient and letting her know that she has chlamydia? I'll go ahead and send the treatment medication in for her.  She will need to have her partner(s) treated.  Do you feel comfortable calling her and letting her know?  She might already know since it has released out to MyChart.

## 2020-03-18 NOTE — Telephone Encounter (Signed)
Patient is calling for labs results. Please advise. 

## 2020-04-09 ENCOUNTER — Ambulatory Visit: Payer: BC Managed Care – PPO | Admitting: Physician Assistant

## 2020-04-09 ENCOUNTER — Other Ambulatory Visit: Payer: Self-pay

## 2020-04-09 DIAGNOSIS — Z299 Encounter for prophylactic measures, unspecified: Secondary | ICD-10-CM

## 2020-04-09 DIAGNOSIS — N760 Acute vaginitis: Secondary | ICD-10-CM

## 2020-04-09 DIAGNOSIS — Z113 Encounter for screening for infections with a predominantly sexual mode of transmission: Secondary | ICD-10-CM

## 2020-04-09 LAB — URINALYSIS
Bilirubin, UA: NEGATIVE
Glucose, UA: NEGATIVE
Ketones, UA: NEGATIVE
Nitrite, UA: NEGATIVE
Protein,UA: NEGATIVE
Specific Gravity, UA: 1.02 (ref 1.005–1.030)
Urobilinogen, Ur: 1 mg/dL (ref 0.2–1.0)
pH, UA: 7 (ref 5.0–7.5)

## 2020-04-09 LAB — URINALYSIS, MICROSCOPIC ONLY
Casts: NONE SEEN /lpf
Crystals: NONE SEEN
Renal Epithel, UA: NONE SEEN /hpf
Trichomonas, UA: NONE SEEN
Yeast, UA: NONE SEEN

## 2020-04-09 LAB — WET PREP FOR TRICH, YEAST, CLUE
Trichomonas Exam: NEGATIVE
Yeast Exam: NEGATIVE

## 2020-04-09 LAB — PREGNANCY, URINE: Preg Test, Ur: NEGATIVE

## 2020-04-09 MED ORDER — DOXYCYCLINE HYCLATE 100 MG PO TABS: 100.0000 mg | ORAL_TABLET | Freq: Two times a day (BID) | ORAL | 0 refills | Status: AC

## 2020-04-09 MED ORDER — METRONIDAZOLE 500 MG PO TABS: 500.0000 mg | ORAL_TABLET | Freq: Two times a day (BID) | ORAL | 0 refills | Status: AC

## 2020-04-09 NOTE — Progress Notes (Signed)
Provider reviewed wet mount and urine dip; pt treated per provider orders. Provider orders completed.

## 2020-04-11 ENCOUNTER — Encounter: Payer: Self-pay | Admitting: Physician Assistant

## 2020-04-11 NOTE — Progress Notes (Signed)
[facility] Department STI clinic/screening visit  Subjective:  Miranda Fields is a 29 y.o. female being seen today for an STI screening visit. The patient reports they do have symptoms.  Patient reports that they do not desire a pregnancy in the next year.   They reported they are not interested in discussing contraception today.  No LMP recorded (exact date). (Menstrual status: IUD).   Patient has the following medical conditions:   Patient Active Problem List   Diagnosis Date Noted  . Supervision of high risk pregnancy, antepartum 11/14/2018  . History of pre-eclampsia 12/17/2017    Chief Complaint  Patient presents with  . SEXUALLY TRANSMITTED DISEASE    screening    HPI  Patient reports that she was treated for Chlamydia about 2 weeks ago but is still having symptoms.  Reports that she is having stomach and lower back pain, urinary frequency and urgency but denies nausea, vomiting, fever, chills, diarrhea.  States that she does not have any chronic conditions or take any medicines regularly.  Has an IUD for [facility].  Last pap was 2 weeks ago with PCP and last HIV test was within the last year.  See flowsheet for further details and programmatic requirements.    The following portions of the patient's history were reviewed and updated as appropriate: allergies, current medications, past medical history, past social history, past surgical history and problem list.  Objective:  There were no vitals filed for this visit.  Physical Exam Constitutional:      General: She is not in acute distress.    Appearance: Normal appearance. She is obese.  HENT:     Head: Normocephalic and atraumatic.     Comments: No nits, lice or hair loss. No cervical, supraclavicular or axillary adenopathy.    Mouth/Throat:     Mouth: Mucous membranes are moist.     Pharynx: Oropharynx is clear. No oropharyngeal exudate or posterior oropharyngeal erythema.  Eyes:     Conjunctiva/sclera:  Conjunctivae normal.  Pulmonary:     Effort: Pulmonary effort is normal.  Abdominal:     Palpations: Abdomen is soft. There is no mass.     Tenderness: There is abdominal tenderness. There is no guarding or rebound.     Comments: Tenderness lower quadrants bilaterally on deep palpation.  Genitourinary:    General: Normal vulva.     Rectum: Normal.     Comments: External genitalia/pubic area without nits, lice, edema, erythema, lesions and inguinal adenopathy. Vagina with normal mucosa, small amount of menstrual blood. Cervix without visible lesions and IUD strings palpated. Uterus firm, mobile, nt, no masses, no CMT, no adnexal tenderness or fullness. Musculoskeletal:     Cervical back: Neck supple. No tenderness.  Skin:    General: Skin is warm and dry.     Findings: No bruising, erythema, lesion or rash.  Neurological:     Mental Status: She is alert and oriented to person, place, and time.  Psychiatric:        Mood and Affect: Mood normal.        Behavior: Behavior normal.        Thought Content: Thought content normal.        Judgment: Judgment normal.      Assessment and Plan:  Miranda Fields is a 29 y.o. female presenting to the [facility] Department for STI screening  1. Screening for STD (sexually transmitted disease) Patient into clinic with symptoms. Rec condoms with all sex. Await  test results.  Counseled that RN will call if needs to RTC for further treatment once results are back.  - Pregnancy, urine - Urinalysis (Urine Dip) - Urine Microscopic - WET PREP FOR TRICH, YEAST, CLUE - Chlamydia/Gonorrhea [facility]  2. BV (bacterial vaginosis) Will treat with Metronidazole 500mg  #14 1 po BID for 7 days with food, no EtOH for 24 hr before and until 72 hr after completing medicine. No sex for 7 days. - metroNIDAZOLE (FLAGYL) 500 MG tablet; Take 1 tablet (500 mg total) by mouth 2 (two) times daily for 7 days.  Dispense: 14 tablet; Refill: 0  3.  Prophylactic measure Will treat with Doxycycline 100mg  #14 1 po BID for 7 days to cover for resistant Chlamydia and endometritis. No sex for 7 days and until is sure that partner has completed treatment. Rec OTC antifungal cream if has itching during or just after treatment with antibiotics. Rec to PCP/ER if any symptoms worsen during treatment or has N, V, fever, chills. - doxycycline (VIBRA-TABS) 100 MG tablet; Take 1 tablet (100 mg total) by mouth 2 (two) times daily for 7 days.  Dispense: 14 tablet; Refill: 0     No follow-ups on file.  No future appointments.  , PA

## 2020-05-04 ENCOUNTER — Ambulatory Visit: Payer: Self-pay | Admitting: Family Medicine

## 2020-05-04 ENCOUNTER — Other Ambulatory Visit: Payer: Self-pay

## 2020-05-04 ENCOUNTER — Encounter: Payer: Self-pay | Admitting: Family Medicine

## 2020-05-04 DIAGNOSIS — Z113 Encounter for screening for infections with a predominantly sexual mode of transmission: Secondary | ICD-10-CM

## 2020-05-04 DIAGNOSIS — R109 Unspecified abdominal pain: Secondary | ICD-10-CM

## 2020-05-04 LAB — WET PREP FOR TRICH, YEAST, CLUE
Trichomonas Exam: NEGATIVE
Yeast Exam: NEGATIVE

## 2020-05-04 NOTE — Progress Notes (Signed)
Wet mount reviewed with provider and no treatment needed for wet mount per standing order and per Larene Pickett, FNP verbal order. Counseled pt per provider orders and pt states understanding. Provider orders completed.

## 2020-05-04 NOTE — Progress Notes (Signed)
[facility] Department STI clinic/screening visit  Subjective:  Miranda Fields is a 29 y.o. female being seen today for an STI screening visit. The patient reports they do have symptoms.  Patient reports that they do not desire a pregnancy in the next year.   They reported they are not interested in discussing contraception today.  No LMP recorded (approximate). (Menstrual status: IUD).   Patient has the following medical conditions:   Patient Active Problem List   Diagnosis Date Noted  . Supervision of high risk pregnancy, antepartum 11/14/2018  . History of pre-eclampsia 12/17/2017    No chief complaint on file.   HPI  Patient reports that she has had daily, intermittent mid-abdominal pain 7/10.  States that she  believes taking the Azithromycin and Metronidazole 04/09/20 may have triggered the pain.  She denies STD symptoms.  She has had h/o heartburn. Denies that foods seem to exacerbate her symptoms. She taken no OTC meds for heartburn.    Last HIV test per patient/review of record was 2019 Patient reports last pap was 2020  See flowsheet for further details and programmatic requirements.    The following portions of the patient's history were reviewed and updated as appropriate: allergies, current medications, past medical history, past social history, past surgical history and problem list.  Objective:  There were no vitals filed for this visit.  Physical Exam Vitals and nursing note reviewed.  Constitutional:      Appearance: Normal appearance.  HENT:     Head: Normocephalic and atraumatic.     Mouth/Throat:     Mouth: Mucous membranes are moist.     Pharynx: Oropharynx is clear. No oropharyngeal exudate or posterior oropharyngeal erythema.  Pulmonary:     Effort: Pulmonary effort is normal.  Abdominal:     General: Abdomen is flat.     Palpations: There is no mass.     Tenderness: There is abdominal tenderness. There is no rebound.     Comments:  Mild tenderness at umbilicus and epigastric area.  Genitourinary:    General: Normal vulva.     Exam position: Lithotomy position.     Pubic Area: No rash or pubic lice.      Labia:        Right: No rash or lesion.        Left: No rash or lesion.      Vagina: Normal. No vaginal discharge, erythema, bleeding or lesions.     Cervix: No cervical motion tenderness, discharge, friability, lesion or erythema.     Uterus: Normal.      Adnexa: Right adnexa normal and left adnexa normal.     Rectum: Normal.  Lymphadenopathy:     Head:     Right side of head: No preauricular or posterior auricular adenopathy.     Left side of head: No preauricular or posterior auricular adenopathy.     Cervical: No cervical adenopathy.     Upper Body:     Right upper body: No supraclavicular or axillary adenopathy.     Left upper body: No supraclavicular or axillary adenopathy.     Lower Body: No right inguinal adenopathy. No left inguinal adenopathy.  Skin:    General: Skin is warm and dry.     Findings: No rash.  Neurological:     Mental Status: She is alert and oriented to person, place, and time.    Assessment and Plan:  Miranda Fields is a 29 y.o. female presenting to the  [facility] Department for STI screening  1. Screening examination for venereal disease  - WET PREP FOR TRICH, YEAST, CLUE - Chlamydia/Gonorrhea [facility] client that her treatment for GC/Chlamydia  Co. Client that the GC/chlamyudia test was done @ 3 1/2 weeks and may give an false +.   2. Abdominal pain Co. Client to take OTC Prilosec x 2-4 week, modify diet to d/c sodas/greasy/spicy foods.  Drink plenty of water, eat fruit/vegs and lean meats.  Also suggested to begin daily exercise.  If pain continues needs to see PCP for further management.  No follow-ups on file.  No future appointments.  Larene Pickett, FNP

## 2020-05-06 ENCOUNTER — Emergency Department
Admission: EM | Admit: 2020-05-06 | Discharge: 2020-05-06 | Disposition: A | Discharge status: Home / Self Care | Payer: BC Managed Care – PPO | Attending: Emergency Medicine | Admitting: Emergency Medicine

## 2020-05-06 ENCOUNTER — Other Ambulatory Visit: Payer: Self-pay

## 2020-05-06 ENCOUNTER — Encounter: Payer: Self-pay | Admitting: Emergency Medicine

## 2020-05-06 ENCOUNTER — Emergency Department: Payer: BC Managed Care – PPO

## 2020-05-06 DIAGNOSIS — K5900 Constipation, unspecified: Secondary | ICD-10-CM

## 2020-05-06 LAB — COMPREHENSIVE METABOLIC PANEL
ALT: 33 U/L (ref 0–44)
AST: 28 U/L (ref 15–41)
Albumin: 3.9 g/dL (ref 3.5–5.0)
Alkaline Phosphatase: 71 U/L (ref 38–126)
Anion gap: 7 (ref 5–15)
BUN: 10 mg/dL (ref 6–20)
CO2: 28 mmol/L (ref 22–32)
Calcium: 9 mg/dL (ref 8.9–10.3)
Chloride: 102 mmol/L (ref 98–111)
Creatinine, Ser: 0.82 mg/dL (ref 0.44–1.00)
GFR calc Af Amer: >60 mL/min (ref >60)
GFR calc non Af Amer: >60 mL/min (ref >60)
Glucose, Bld: 106 mg/dL — ABNORMAL HIGH (ref 70–99)
Potassium: 4.1 mmol/L (ref 3.5–5.1)
Sodium: 137 mmol/L (ref 135–145)
Total Bilirubin: 0.6 mg/dL (ref 0.3–1.2)
Total Protein: 7.4 g/dL (ref 6.5–8.1)

## 2020-05-06 LAB — URINALYSIS, COMPLETE (UACMP) WITH MICROSCOPIC
Bilirubin Urine: NEGATIVE
Glucose, UA: NEGATIVE mg/dL
Ketones, ur: NEGATIVE mg/dL
Leukocytes,Ua: NEGATIVE
Nitrite: NEGATIVE
Protein, ur: NEGATIVE mg/dL
Specific Gravity, Urine: 1.013 (ref 1.005–1.030)
pH: 7 (ref 5.0–8.0)

## 2020-05-06 LAB — CBC
HCT: 39.3 % (ref 36.0–46.0)
Hemoglobin: 13.5 g/dL (ref 12.0–15.0)
MCH: 29.2 pg (ref 26.0–34.0)
MCHC: 34.4 g/dL (ref 30.0–36.0)
MCV: 85.1 fL (ref 80.0–100.0)
Platelets: 260 10*3/uL (ref 150–400)
RBC: 4.62 MIL/uL (ref 3.87–5.11)
RDW: 13.3 % (ref 11.5–15.5)
WBC: 5.8 10*3/uL (ref 4.0–10.5)
nRBC: 0 % (ref 0.0–0.2)

## 2020-05-06 LAB — POCT PREGNANCY, URINE: Preg Test, Ur: NEGATIVE

## 2020-05-06 LAB — LIPASE, BLOOD: Lipase: 26 U/L (ref 11–51)

## 2020-05-06 LAB — PREGNANCY, URINE: Preg Test, Ur: NEGATIVE

## 2020-05-06 MED ORDER — CALCIUM CARBONATE ANTACID 500 MG PO CHEW: 2.0000 | CHEWABLE_TABLET | Freq: Three times a day (TID) | ORAL | 0 refills | Status: AC | PRN

## 2020-05-06 MED ORDER — SENNOSIDES-DOCUSATE SODIUM 8.6-50 MG PO TABS
2.0000 | ORAL_TABLET | Freq: Two times a day (BID) | ORAL | 0 refills | Status: DC
Start: 2020-05-06 — End: 2022-11-02

## 2020-05-06 MED ORDER — SODIUM CHLORIDE 0.9 % IV BOLUS
1000.0000 mL | Freq: Once | INTRAVENOUS | Status: AC
  Administered 2020-05-06: 1000 mL via INTRAVENOUS

## 2020-05-06 MED ORDER — FAMOTIDINE 20 MG PO TABS
20.0000 mg | ORAL_TABLET | Freq: Two times a day (BID) | ORAL | 0 refills | Status: DC
Start: 2020-05-06 — End: 2022-11-02

## 2020-05-06 MED ORDER — IOHEXOL 300 MG/ML  SOLN
125.0000 mL | Freq: Once | INTRAMUSCULAR | Status: AC | PRN
  Administered 2020-05-06: 125 mL via INTRAVENOUS
  Filled 2020-05-06: qty 125

## 2020-05-06 MED ORDER — SODIUM CHLORIDE 0.9% FLUSH
3.0000 mL | Freq: Once | INTRAVENOUS | Status: AC
  Administered 2020-05-06: 3 mL via INTRAVENOUS

## 2020-05-06 NOTE — Discharge Instructions (Signed)
Your labs and CT scan today were all okay.
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
## 2020-05-06 NOTE — ED Notes (Signed)
Patient transported to CT 

## 2020-05-06 NOTE — ED Triage Notes (Signed)
C/O no BM x 10 days.  Has taken a Mag Citrate x 2, no results.  AAOx3.  Skin warm and dry. NAD

## 2020-05-06 NOTE — ED Notes (Signed)
Pt ambulatory to bathroom independently

## 2020-05-07 LAB — POCT PREGNANCY, URINE: Preg Test, Ur: NEGATIVE

## 2020-08-12 ENCOUNTER — Ambulatory Visit: Payer: Self-pay | Admitting: Physician Assistant

## 2020-08-12 ENCOUNTER — Other Ambulatory Visit: Payer: Self-pay

## 2020-08-12 ENCOUNTER — Ambulatory Visit: Payer: BC Managed Care – PPO

## 2020-08-12 ENCOUNTER — Encounter: Payer: Self-pay | Admitting: Physician Assistant

## 2020-08-12 DIAGNOSIS — Z113 Encounter for screening for infections with a predominantly sexual mode of transmission: Secondary | ICD-10-CM

## 2020-08-12 LAB — WET PREP FOR TRICH, YEAST, CLUE
Trichomonas Exam: NEGATIVE
Yeast Exam: NEGATIVE

## 2020-08-12 NOTE — Progress Notes (Signed)
Patient here for STD testing, declines bloodwork today. States she has had covid vaccines previously, and will consider flu vaccine for another visit.Burt Knack, RN

## 2020-08-12 NOTE — Progress Notes (Signed)
[facility] Department STI clinic/screening visit  Subjective:  Miranda Fields is a 29 y.o. female being seen today for an STI screening visit. The patient reports they do have symptoms.  Patient reports that they do not desire a pregnancy in the next year.   They reported they are not interested in discussing contraception today.  No LMP recorded (lmp unknown). (Menstrual status: IUD).   Patient has the following medical conditions:   Patient Active Problem List   Diagnosis Date Noted  . History of pre-eclampsia 12/17/2017    Chief Complaint  Patient presents with  . Exposure to STD    29 yo woman here for eval of 10 day h/o intermittent low abd pain. No N/V/F/dysuria/frequency/constipation/vaginal discharge. May have had vag odor. No pain since yesterday. Does not douche.   Patient reports see HPI.  Last HIV test per patient/review of record was 04/2020  Patient reports last pap was 03/2019 (WNL).   See flowsheet for further details and programmatic requirements.    The following portions of the patient's history were reviewed and updated as appropriate: allergies, current medications, past medical history, past social history, past surgical history and problem list.  Objective:  There were no vitals filed for this visit.  Physical Exam Vitals and nursing note reviewed.  Constitutional:      Appearance: Normal appearance. She is obese.  HENT:     Head: Normocephalic and atraumatic.     Mouth/Throat:     Mouth: Mucous membranes are moist.     Pharynx: Oropharynx is clear. No oropharyngeal exudate or posterior oropharyngeal erythema.  Pulmonary:     Effort: Pulmonary effort is normal.  Abdominal:     General: Abdomen is flat.     Palpations: There is no mass.     Tenderness: There is no abdominal tenderness. There is no rebound.  Genitourinary:    General: Normal vulva.     Exam position: Lithotomy position.     Pubic Area: No rash or pubic lice.       Labia:        Right: No rash or lesion.        Left: No rash or lesion.      Vagina: Normal. No vaginal discharge, erythema, bleeding or lesions.     Cervix: No cervical motion tenderness, discharge, friability, lesion or erythema.     Uterus: Normal.      Adnexa: Right adnexa normal and left adnexa normal.     Rectum: Normal.     Comments: IUD strings palpable Lymphadenopathy:     Head:     Right side of head: No preauricular or posterior auricular adenopathy.     Left side of head: No preauricular or posterior auricular adenopathy.     Cervical: No cervical adenopathy.     Upper Body:     Right upper body: No supraclavicular or axillary adenopathy.     Left upper body: No supraclavicular or axillary adenopathy.     Lower Body: No right inguinal adenopathy. No left inguinal adenopathy.  Skin:    General: Skin is warm and dry.     Findings: No rash.  Neurological:     Mental Status: She is alert and oriented to person, place, and time.   Vag pH < 4.5   Assessment and Plan:  Miranda Fields is a 29 y.o. female presenting to the [facility] Department for STI screening  1. Routine screening for STI (sexually transmitted infection) Wet  prep clear. No treatment indicated. Await GC/Chlam results. - Chlamydia/Gonorrhea [place] [facility] - WET PREP FOR TRICH, YEAST, CLUE     Return if symptoms worsen or fail to improve, though better to see PCP for more comprehensive eval of ss.  Future Appointments  Date Time Provider Department Center  08/24/2020  1:45 PM Pasty Spillers, MD [facility] None    [place]ry [place]ke, [place]

## 2020-08-12 NOTE — Progress Notes (Signed)
Wet mount reviewed, no treatment indicated..Ethyn Schetter Brewer-Jensen, RN 

## 2020-08-24 ENCOUNTER — Ambulatory Visit: Payer: 59 | Admitting: Gastroenterology

## 2020-10-25 ENCOUNTER — Encounter: Payer: Self-pay | Admitting: *Deleted

## 2020-10-25 ENCOUNTER — Ambulatory Visit: Payer: 59 | Admitting: Gastroenterology

## 2021-06-14 ENCOUNTER — Encounter: Payer: Self-pay | Admitting: Advanced Practice Midwife

## 2021-06-14 ENCOUNTER — Ambulatory Visit: Payer: BC Managed Care – PPO | Admitting: Advanced Practice Midwife

## 2021-06-14 ENCOUNTER — Other Ambulatory Visit: Payer: Self-pay

## 2021-06-14 DIAGNOSIS — Z113 Encounter for screening for infections with a predominantly sexual mode of transmission: Secondary | ICD-10-CM

## 2021-06-14 LAB — WET PREP FOR TRICH, YEAST, CLUE
Trichomonas Exam: NEGATIVE
Yeast Exam: NEGATIVE
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
## 2021-06-19 LAB — GONOCOCCUS CULTURE

## 2021-08-09 ENCOUNTER — Ambulatory Visit: Payer: Self-pay | Admitting: Physician Assistant

## 2021-08-09 ENCOUNTER — Other Ambulatory Visit: Payer: Self-pay

## 2021-08-09 DIAGNOSIS — Z113 Encounter for screening for infections with a predominantly sexual mode of transmission: Secondary | ICD-10-CM

## 2021-08-09 LAB — WET PREP FOR TRICH, YEAST, CLUE
Trichomonas Exam: NEGATIVE
Yeast Exam: NEGATIVE

## 2021-08-10 ENCOUNTER — Encounter: Payer: Self-pay | Admitting: Physician Assistant

## 2021-08-10 NOTE — Progress Notes (Signed)
[facility] Department STI clinic/screening visit  Subjective:  MELODIE ASHWORTH is a 30 y.o. female being seen today for an STI screening visit. The patient reports they do have symptoms.  Patient reports that they do not desire a pregnancy in the next year.   They reported they are not interested in discussing contraception today.  No LMP recorded. (Menstrual status: IUD).   Patient has the following medical conditions:   Patient Active Problem List   Diagnosis Date Noted   Morbid obesity (HCC) 253 lbs 06/14/2021   History of pre-eclampsia 12/17/2017    Chief Complaint  Patient presents with   SEXUALLY TRANSMITTED DISEASE    screening    HPI  Patient reports that she has had some cramping for 2 weeks and had bleeding for 1 week.  Denies other symptoms and regular medicines.  Patient reports that she has an IUD and does not usually have bleeding.  States last HIV test was in 2019 or 2020 and last pap was 02/2020.     See flowsheet for further details and programmatic requirements.    The following portions of the patient's history were reviewed and updated as appropriate: allergies, current medications, past medical history, past social history, past surgical history and problem list.  Objective:  There were no vitals filed for this visit.  Physical Exam Constitutional:      General: She is not in acute distress.    Appearance: Normal appearance.  HENT:     Head: Normocephalic and atraumatic.     Comments: No nits,lice, or hair loss. No cervical, supraclavicular or axillary adenopathy.     Mouth/Throat:     Mouth: Mucous membranes are moist.     Pharynx: Oropharynx is clear. No oropharyngeal exudate or posterior oropharyngeal erythema.  Eyes:     Conjunctiva/sclera: Conjunctivae normal.  Pulmonary:     Effort: Pulmonary effort is normal.  Abdominal:     Palpations: Abdomen is soft. There is no mass.     Tenderness: There is no abdominal tenderness. There  is no guarding or rebound.  Genitourinary:    General: Normal vulva.     Rectum: Normal.     Comments: External genitalia/pubic area without nits, lice, edema, erythema, lesions and inguinal adenopathy. Vagina with normal mucosa and discharge. Cervix without visible lesions. IUD strings visualized at appropriate length and palpated during bimanual exam. Uterus firm, mobile, nt, no masses, no CMT, no adnexal tenderness or fullness.  Musculoskeletal:     Cervical back: Neck supple. No tenderness.  Skin:    General: Skin is warm and dry.     Findings: No bruising, erythema, lesion or rash.  Neurological:     Mental Status: She is alert and oriented to person, place, and time.  Psychiatric:        Mood and Affect: Mood normal.        Behavior: Behavior normal.        Thought Content: Thought content normal.        Judgment: Judgment normal.     Assessment and Plan:  AXIE HAYNE is a 30 y.o. female presenting to the [facility] Department for STI screening  1. Screening for STD (sexually transmitted disease) Patient into clinic with symptoms. Reviewed with patient wet mount results and that no treatment is indicated today. Reassured patient that sometimes added stressors can lead to irregular bleeding and it is also a normal SE of the IUD. Rec condoms with all sex. Await test  results.  Counseled that RN will call if needs to RTC for treatment once results are back.  - WET PREP FOR TRICH, YEAST, CLUE - Chlamydia/Gonorrhea [place] [facility] - HIV [place] [facility] - Syphilis Serology, [place]     No follow-ups on file.  No future appointments.  Matt Holmes, PA

## 2021-10-12 IMAGING — US US PELVIS COMPLETE WITH TRANSVAGINAL
1 series · 14 of 25 positions shown · non-contrast
Comparison: Ob ultrasound 11/14/2018

CLINICAL DATA: Pelvic pain for 1 week



[Series 1: us pelvic complete with transvaginal · 14 of 115 slices shown]
[im 1/115]
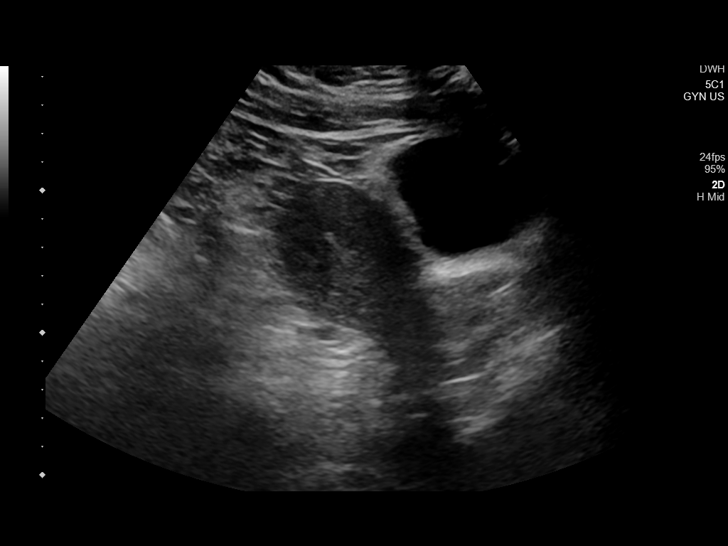
[im 10/115]
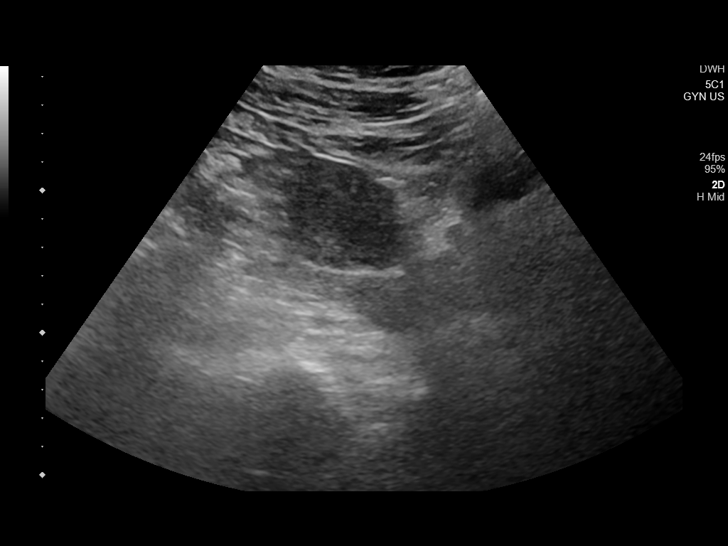
[im 20/115]
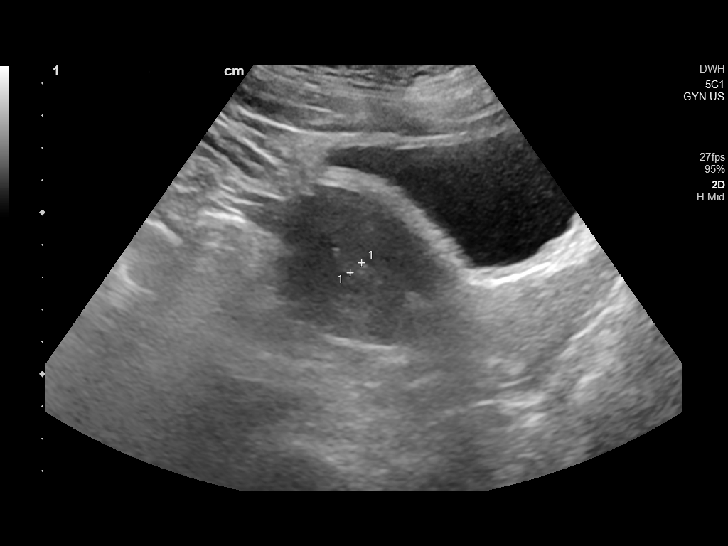
[im 29/115]
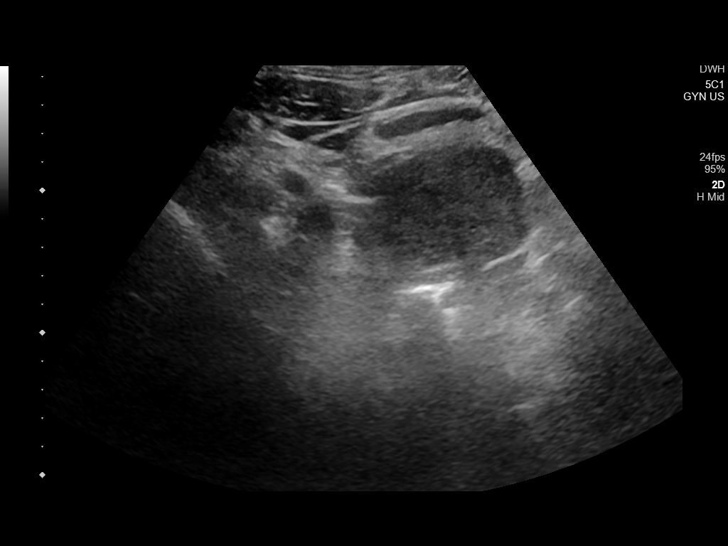
[im 39/115]
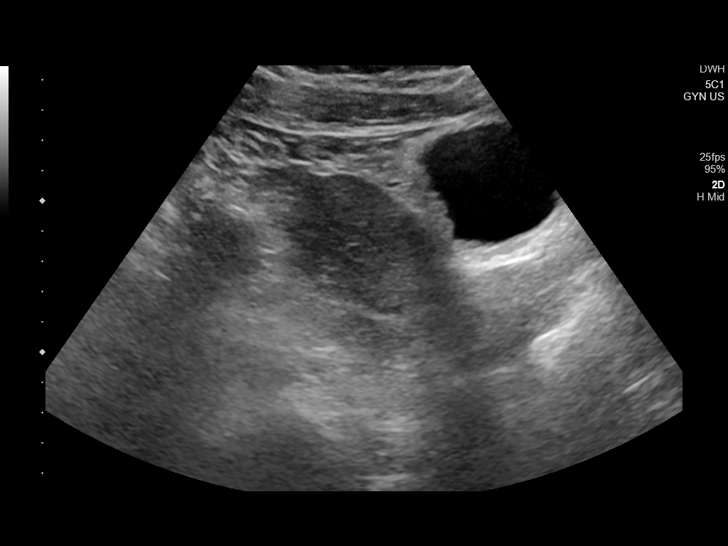
[im 43/115]
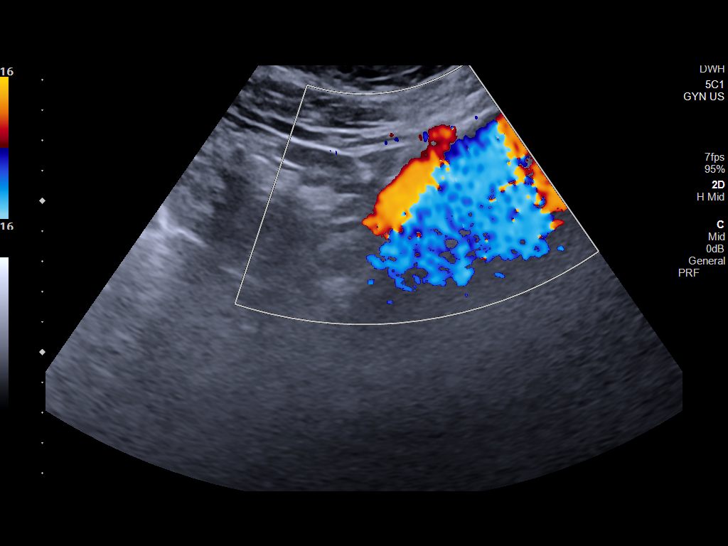
[im 53/115]
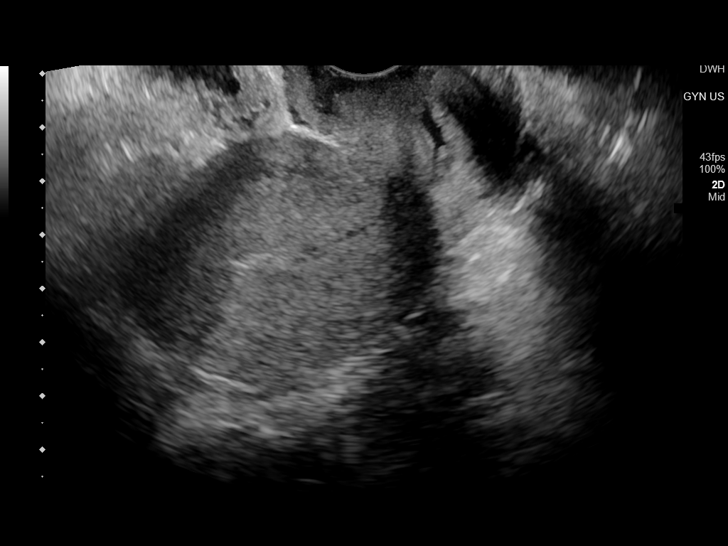
[im 62/115]
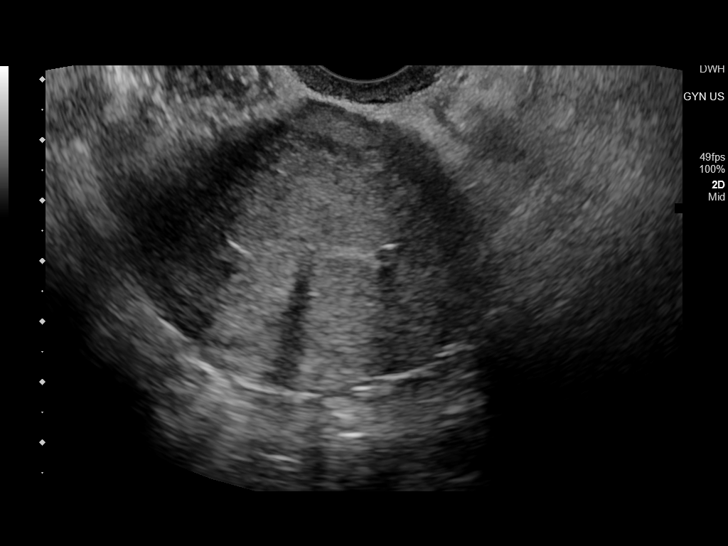
[im 72/115]
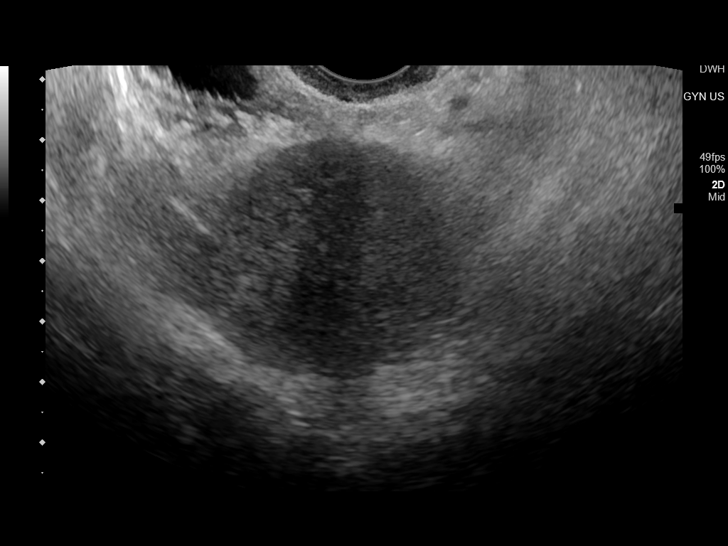
[im 77/115]
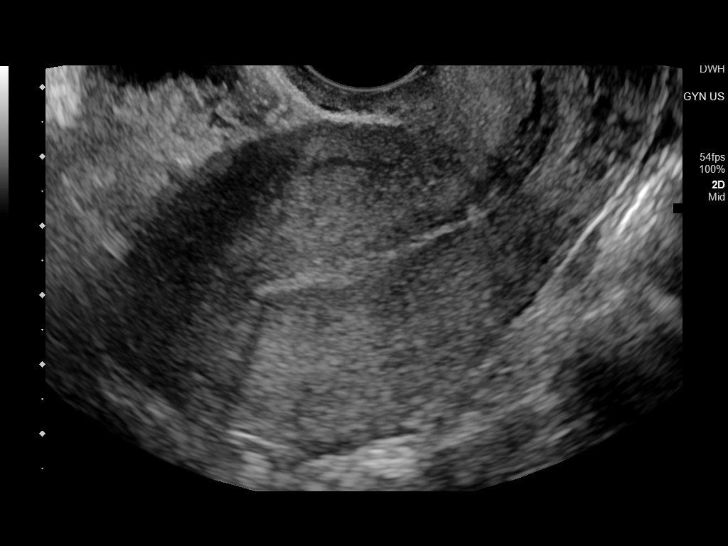
[im 86/115]
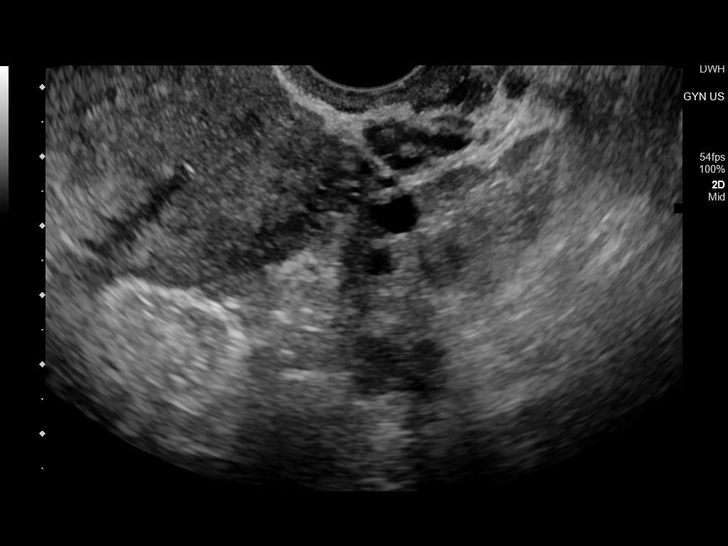
[im 96/115]
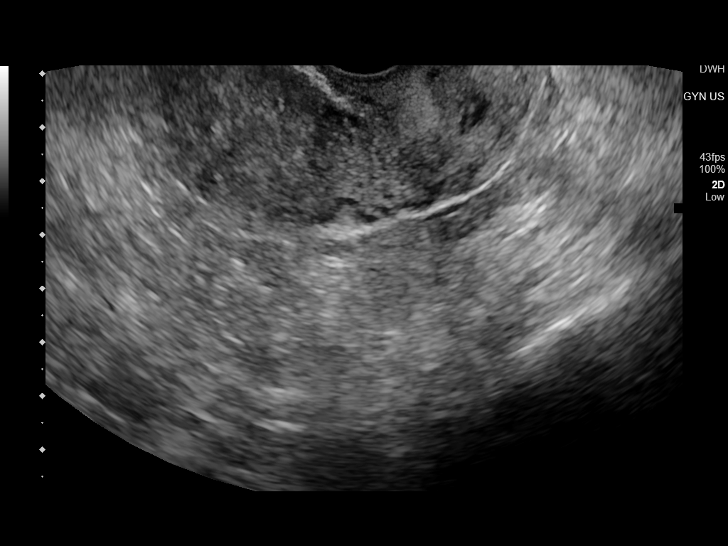
[im 105/115]
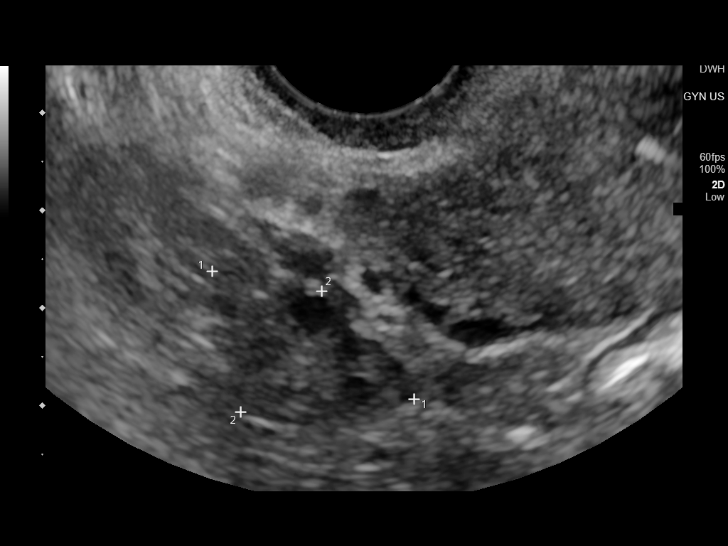
[im 115/115]
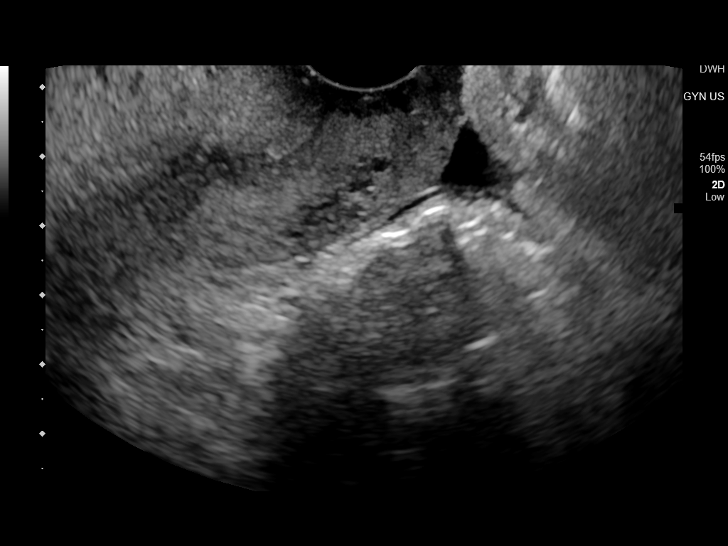

[14 of 25 positions shown; findings below may reference images not displayed]

FINDINGS: Uterus

Measurements: 9 x 5 x 6 cm = volume: 125 mL. No fibroids or other
mass visualized.

Endometrium

Thickness: 4 mm. No focal abnormality visualized. There is an IUD in
expected position

Right ovary

Measurements: 32 x 16 x 15 mm = volume: 4 mL. Normal appearance/no
adnexal mass.

Left ovary

Measurements: 25 x 15 x 15 mm = volume: 3 mL. Normal appearance/no
adnexal mass.
IMPRESSION: Negative pelvic ultrasound.  The IUD is in expected position.

## 2021-10-26 ENCOUNTER — Ambulatory Visit: Payer: Self-pay | Admitting: Family Medicine

## 2021-10-26 ENCOUNTER — Encounter: Payer: Self-pay | Admitting: Family Medicine

## 2021-10-26 ENCOUNTER — Other Ambulatory Visit: Payer: Self-pay

## 2021-10-26 DIAGNOSIS — N760 Acute vaginitis: Secondary | ICD-10-CM

## 2021-10-26 DIAGNOSIS — Z113 Encounter for screening for infections with a predominantly sexual mode of transmission: Secondary | ICD-10-CM

## 2021-10-26 LAB — WET PREP FOR TRICH, YEAST, CLUE
Trichomonas Exam: NEGATIVE
Yeast Exam: NEGATIVE

## 2021-10-26 MED ORDER — METRONIDAZOLE 500 MG PO TABS: 500.0000 mg | ORAL_TABLET | Freq: Two times a day (BID) | ORAL | 0 refills | Status: AC

## 2021-10-26 NOTE — Progress Notes (Signed)
Pt here for STD screening.  Wet mount results reviewed and medication dispensed per SO.  Jemell Town M Niyanna Asch, RN  

## 2021-10-26 NOTE — Progress Notes (Signed)
[facility] Department  STI clinic/screening visit [address] [place] [address] [phone]  Subjective:  Miranda Fields is a 30 y.o. female being seen today for an STI screening visit. The patient reports they do have symptoms.  Patient reports that they do desire a pregnancy in the next year.   They reported they are not interested in discussing contraception today.    No LMP recorded. (Menstrual status: IUD).   Patient has the following medical conditions:   Patient Active Problem List   Diagnosis Date Noted   Morbid obesity (HCC) 253 lbs 06/14/2021   History of pre-eclampsia 12/17/2017    Chief Complaint  Patient presents with   SEXUALLY TRANSMITTED DISEASE    HPI  Patient reports here for screening, pt used nair and reports having s/sx after use.    Last HIV test per patient/review of record was 08/09/21 Patient reports last pap was 03/16/2020.   Screening for MPX risk: Does the patient have an unexplained rash? No Is the patient MSM? No Does the patient endorse multiple sex partners or anonymous sex partners? No Did the patient have close or sexual contact with a person diagnosed with MPX? No Has the patient traveled outside the [place] where MPX is endemic? No Is there a high clinical suspicion for MPX-- evidenced by one of the following No  -Unlikely to be chickenpox  -Lymphadenopathy  -Rash that present in same phase of evolution on any given body part See flowsheet for further details and programmatic requirements.    The following portions of the patient's history were reviewed and updated as appropriate: allergies, current medications, past medical history, past social history, past surgical history and problem list.  Objective:  There were no vitals filed for this visit.  Physical Exam Vitals and nursing note reviewed.  Constitutional:      Appearance: Normal appearance.  HENT:     Head: Normocephalic and atraumatic.      Mouth/Throat:     Mouth: Mucous membranes are moist.     Pharynx: Oropharynx is clear. No oropharyngeal exudate or posterior oropharyngeal erythema.  Pulmonary:     Effort: Pulmonary effort is normal.  Abdominal:     General: Abdomen is flat.     Palpations: There is no mass.     Tenderness: There is no abdominal tenderness. There is no rebound.  Genitourinary:    General: Normal vulva.     Exam position: Lithotomy position.     Pubic Area: No rash or pubic lice.      Labia:        Right: No rash or lesion.        Left: No rash or lesion.      Vagina: Normal. No vaginal discharge, erythema, bleeding or lesions.     Cervix: No cervical motion tenderness, discharge, friability, lesion or erythema.     Uterus: Normal.      Adnexa: Right adnexa normal and left adnexa normal.     Rectum: Normal.  Musculoskeletal:     Cervical back: Normal range of motion and neck supple.  Lymphadenopathy:     Head:     Right side of head: No preauricular or posterior auricular adenopathy.     Left side of head: No preauricular or posterior auricular adenopathy.     Cervical: No cervical adenopathy.     Upper Body:     Right upper body: No supraclavicular or axillary adenopathy.     Left upper body:  No supraclavicular or axillary adenopathy.     Lower Body: No right inguinal adenopathy. No left inguinal adenopathy.  Skin:    General: Skin is warm and dry.     Findings: No rash.  Neurological:     Mental Status: She is alert and oriented to person, place, and time.     Assessment and Plan:  Miranda Fields is a 30 y.o. female presenting to the [facility] Department for STI screening  1. Screening examination for venereal disease Patient accepted all screenings including wet prep, oral, vaginal CT/GC and bloodwork for HIV/RPR.  Patient meets criteria for HepB screening? No. Ordered? No - does not meet criteria  Patient meets criteria for HepC screening? No. Ordered? No - does not  meet criteria   - Chlamydia/Gonorrhea [facility] - WET PREP FOR TRICH, YEAST, CLUE  2. BV (bacterial vaginosis)  Wet prep results   +amine, +clue  Treatment needed for BV  Discussed time line for State Lab results and that patient will be called with positive results and encouraged patient to call if she had not heard in 2 weeks.  Counseled to return or seek care for continued or worsening symptoms Recommended condom use with all sex  Patient is currently using IUD to prevent pregnancy.   - metroNIDAZOLE (FLAGYL) 500 MG tablet; Take 1 tablet (500 mg total) by mouth 2 (two) times daily for 7 days.  Dispense: 14 tablet; Refill: 0   Return for as needed.  No future appointments.  Wendi Snipes, FNP

## 2021-12-21 ENCOUNTER — Ambulatory Visit: Payer: Self-pay | Admitting: Family Medicine

## 2021-12-21 ENCOUNTER — Encounter: Payer: Self-pay | Admitting: Family Medicine

## 2021-12-21 ENCOUNTER — Other Ambulatory Visit: Payer: Self-pay

## 2021-12-21 DIAGNOSIS — Z113 Encounter for screening for infections with a predominantly sexual mode of transmission: Secondary | ICD-10-CM

## 2021-12-21 LAB — WET PREP FOR TRICH, YEAST, CLUE
Trichomonas Exam: NEGATIVE
Yeast Exam: NEGATIVE

## 2021-12-21 NOTE — Progress Notes (Signed)
[facility] Department  STI clinic/screening visit [address] [place] [address] [phone]  Subjective:  Miranda Fields is a 31 y.o. female being seen today for an STI screening visit. The patient reports they do have symptoms.  Patient reports that they do not desire a pregnancy in the next year.   They reported they are not interested in discussing contraception today.    No LMP recorded. (Menstrual status: IUD).   Patient has the following medical conditions:   Patient Active Problem List   Diagnosis Date Noted   Morbid obesity (HCC) 253 lbs 06/14/2021   History of pre-eclampsia 12/17/2017    Chief Complaint  Patient presents with   SEXUALLY TRANSMITTED DISEASE    Screening    HPI  Patient reports she has been having form external vaginal irritation and odor x 3 days.  Denies discharge.   Denies changing bath soap . The only change she has made was using [employer] detergent recently. But no one else in the family is having problems with the detergent.  Last HIV test per patient/review of record was  Patient reports last pap was unknown   Screening for MPX risk: Does the patient have an unexplained rash? No Is the patient MSM? No Does the patient endorse multiple sex partners or anonymous sex partners? No Did the patient have close or sexual contact with a person diagnosed with MPX? No Has the patient traveled outside the [place] where MPX is endemic? No Is there a high clinical suspicion for MPX-- evidenced by one of the following No  -Unlikely to be chickenpox  -Lymphadenopathy  -Rash that present in same phase of evolution on any given body part See flowsheet for further details and programmatic requirements.    The following portions of the patient's history were reviewed and updated as appropriate: allergies, current medications, past medical history, past social history, past surgical history and problem list.  Objective:  There  were no vitals filed for this visit.   Physical Exam Constitutional:      Appearance: Normal appearance.  HENT:     Head: Normocephalic.     Mouth/Throat:     Pharynx: Oropharynx is clear.  Abdominal:     General: Abdomen is flat.     Palpations: Abdomen is soft.     Tenderness: There is no abdominal tenderness.  Genitourinary:    Comments:  Client deferred pelvic exam- self-collect wet prep and NAAT. Lymphadenopathy:     Cervical: No cervical adenopathy.  Skin:    General: Skin is warm and dry.  Neurological:     Mental Status: She is alert.     Assessment and Plan:  Miranda Fields is a 31 y.o. female presenting to the [facility] Department for STI screening  1. Screening examination for venereal disease  - WET PREP FOR TRICH, YEAST, CLUE - Chlamydia/Gonorrhea [facility]  Recommend to change back to previous detergent. Try using a diaper cream to area to protect skin.  Recommend to change back to previous detergent.    Return if symptoms worsen or fail to improve.  No future appointments.  Larene Pickett, FNP

## 2021-12-21 NOTE — Progress Notes (Signed)
Pt here for STD screening.  Wet mount results reviewed, no treatment required per SO.  Pt declined condoms.  Delanda Bulluck M Brylie Sneath, RN ° °

## 2022-04-26 ENCOUNTER — Encounter: Payer: Self-pay | Admitting: Nurse Practitioner

## 2022-04-26 ENCOUNTER — Ambulatory Visit: Payer: Self-pay | Admitting: Nurse Practitioner

## 2022-04-26 DIAGNOSIS — Z113 Encounter for screening for infections with a predominantly sexual mode of transmission: Secondary | ICD-10-CM

## 2022-04-26 LAB — HM HEPATITIS C SCREENING LAB: HM Hepatitis Screen: NEGATIVE

## 2022-04-26 LAB — WET PREP FOR TRICH, YEAST, CLUE
Trichomonas Exam: NEGATIVE
Yeast Exam: NEGATIVE

## 2022-04-26 LAB — HM HIV SCREENING LAB: HM HIV Screening: NEGATIVE

## 2022-04-26 LAB — HEPATITIS B SURFACE ANTIGEN

## 2022-04-26 NOTE — Progress Notes (Addendum)
[facility] Department  STI clinic/screening visit [address] [place] [address] [phone]  Subjective:  Kelyse Pask is a 31 y.o. female being seen today for an STI screening visit. The patient reports they do have symptoms.  Patient reports that they do not desire a pregnancy in the next year.   They reported they are not interested in discussing contraception today.  Patient uses an IUD as a contraception.   No LMP recorded (lmp unknown). (Menstrual status: IUD).   Patient has the following medical conditions:   Patient Active Problem List   Diagnosis Date Noted   Morbid obesity (HCC) 253 lbs 06/14/2021    Chief Complaint  Patient presents with   SEXUALLY TRANSMITTED DISEASE    HPI  Patient reports to clinic today for STD screening.  Patient reports odor that has been present for 2 weeks.   Last HIV test per patient/review of record was 08/09/21. Patient reports last pap was 03/10/20.   Screening for MPX risk: Does the patient have an unexplained rash? No Is the patient MSM? No Does the patient endorse multiple sex partners or anonymous sex partners? No Did the patient have close or sexual contact with a person diagnosed with MPX? No Has the patient traveled outside the [place] where MPX is endemic? No Is there a high clinical suspicion for MPX-- evidenced by one of the following No  -Unlikely to be chickenpox  -Lymphadenopathy  -Rash that present in same phase of evolution on any given body part See flowsheet for further details and programmatic requirements.   Immunization history:  Immunization History  Administered Date(s) Administered   Hepatitis A 12/20/2007   Hepatitis B 08/09/2001, 09/13/2001, 02/21/2002   Hpv-Unspecified 03/18/2007, 06/10/2007, 10/10/2007   PFIZER(Purple Top)SARS-COV-2 Vaccination 01/25/2020, 02/16/2020   Tdap 06/23/2013     The following portions of the patient's history were reviewed and updated as  appropriate: allergies, current medications, past medical history, past social history, past surgical history and problem list.  Objective:  There were no vitals filed for this visit.  Physical Exam Constitutional:      Appearance: Normal appearance.  HENT:     Head: Normocephalic. No abrasion, masses or laceration. Hair is normal.     Mouth/Throat:     Mouth: No oral lesions.     Dentition: No dental caries.     Pharynx: No oropharyngeal exudate or posterior oropharyngeal erythema.     Tonsils: No tonsillar exudate or tonsillar abscesses.  Eyes:     General: Lids are normal.        Right eye: No discharge.        Left eye: No discharge.     Conjunctiva/sclera: Conjunctivae normal.     Right eye: No exudate.    Left eye: No exudate. Abdominal:     General: Abdomen is flat.     Palpations: Abdomen is soft.     Tenderness: There is no abdominal tenderness. There is no rebound.  Genitourinary:    Pubic Area: No rash or pubic lice.      Labia:        Right: No rash, tenderness, lesion or injury.        Left: No rash, tenderness, lesion or injury.      Vagina: Normal. No vaginal discharge, erythema or lesions.     Cervix: No cervical motion tenderness, discharge, lesion or erythema.     Uterus: Not enlarged and not tender.      Rectum:  Normal.     Comments: Amount Discharge: small  Odor: Yes pH: less than 4.5 Adheres to vaginal wall: No Color: yellow  Nabothian cyst noted on cervix at 1 o'clock  IUD strings noted  Musculoskeletal:     Cervical back: Full passive range of motion without pain, normal range of motion and neck supple.  Lymphadenopathy:     Cervical: No cervical adenopathy.     Right cervical: No superficial, deep or posterior cervical adenopathy.    Left cervical: No superficial, deep or posterior cervical adenopathy.     Upper Body:     Right upper body: No supraclavicular, axillary or epitrochlear adenopathy.     Left upper body: No supraclavicular,  axillary or epitrochlear adenopathy.     Lower Body: No right inguinal adenopathy. No left inguinal adenopathy.  Skin:    General: Skin is warm and dry.     Findings: No lesion or rash.  Neurological:     Mental Status: She is alert and oriented to person, place, and time.  Psychiatric:        Attention and Perception: Attention normal.        Mood and Affect: Mood normal.        Speech: Speech normal.        Behavior: Behavior normal. Behavior is cooperative.      Assessment and Plan:  Tabrina Esty is a 31 y.o. female presenting to the [facility] Department for STI screening  1. Screening examination for venereal disease -31 year old female in clinic for STD screening. -Patient accepted all screenings including oral, vaginal CT/GC, wet prep and bloodwork for HIV/RPR.  Patient meets criteria for HepB screening? Yes. Ordered? Yes Patient meets criteria for HepC screening? Yes. Ordered? Yes  Treat wet prep per standing order Discussed time line for State Lab results and that patient will be called with positive results and encouraged patient to call if she had not heard in 2 weeks.  Counseled to return or seek care for continued or worsening symptoms Recommended condom use with all sex  Patient is currently using  an IUD  to prevent pregnancy.    - HIV/HCV [facility] - Syphilis Serology, [place] [facility] - HBV Antigen/Antibody [facility] - Chlamydia/Gonorrhea [place] - Chlamydia/Gonorrhea [facility] - WET PREP FOR TRICH, YEAST, CLUE     Return if symptoms worsen or fail to improve.    Glenna Fellows, FNP

## 2022-10-02 ENCOUNTER — Other Ambulatory Visit: Payer: Self-pay

## 2022-10-02 ENCOUNTER — Encounter: Payer: Self-pay | Admitting: Emergency Medicine

## 2022-10-02 ENCOUNTER — Emergency Department
Admission: EM | Admit: 2022-10-02 | Discharge: 2022-10-02 | Disposition: A | Discharge status: Home / Self Care | Payer: Medicaid Other | Attending: Emergency Medicine | Admitting: Emergency Medicine

## 2022-10-02 DIAGNOSIS — N760 Acute vaginitis: Secondary | ICD-10-CM | POA: Insufficient documentation

## 2022-10-02 DIAGNOSIS — L292 Pruritus vulvae: Secondary | ICD-10-CM | POA: Diagnosis present

## 2022-10-02 LAB — URINALYSIS, ROUTINE W REFLEX MICROSCOPIC
Bilirubin Urine: NEGATIVE
Glucose, UA: NEGATIVE mg/dL
Ketones, ur: NEGATIVE mg/dL
Nitrite: NEGATIVE
Protein, ur: NEGATIVE mg/dL
Specific Gravity, Urine: 1.016 (ref 1.005–1.030)
pH: 7 (ref 5.0–8.0)

## 2022-10-02 LAB — CHLAMYDIA/NGC RT PCR (ARMC ONLY)
Chlamydia Tr: NOT DETECTED
N gonorrhoeae: NOT DETECTED

## 2022-10-02 LAB — WET PREP, GENITAL
Sperm: NONE SEEN
Trich, Wet Prep: NONE SEEN
WBC, Wet Prep HPF POC: >=10 — AB (ref <10)
Yeast Wet Prep HPF POC: NONE SEEN

## 2022-10-02 LAB — PREGNANCY, URINE: Preg Test, Ur: NEGATIVE

## 2022-10-02 MED ORDER — METRONIDAZOLE 500 MG PO TABS: 500.0000 mg | ORAL_TABLET | Freq: Two times a day (BID) | ORAL | 0 refills | Status: AC

## 2022-10-02 NOTE — ED Provider Triage Note (Signed)
Emergency Medicine Provider Triage Evaluation Note  Miranda Fields , a 31 y.o. female  was evaluated in triage.  Pt complains of vaginal pain with foul odor after having intercourse with a female and a female 2 days ago. She also has a sore area in her mouth.  Physical Exam  BP (!) 157/111   Pulse 73   Temp 98.3 F (36.8 C)   Resp 18   Ht 5\' 5"  (1.651 m)   Wt 108.9 kg   SpO2 97%   BMI 39.94 kg/m  Gen:   Awake, no distress   Resp:  Normal effort  MSK:   Moves extremities without difficulty  Other:    Medical Decision Making  Medically screening exam initiated at 12:57 PM.  Appropriate orders placed.  was informed that the remainder of the evaluation will be completed by another provider, this initial triage assessment does not replace that evaluation, and the importance of remaining in the ED until their evaluation is complete.    Harvie Junior, FNP 10/02/22 1713

## 2022-10-02 NOTE — Discharge Instructions (Signed)
Follow-up with your primary care provider if you are not improving over the week.  Return to the emergency department for symptoms change or worsen if you are unable to schedule appointment.

## 2022-10-02 NOTE — ED Provider Notes (Signed)
[facility] Provider Note    Event Date/Time   First MD Initiated Contact with Patient 10/02/22 1543     (approximate)   History   Vaginal Itching   HPI  Miranda Fields is a 31 y.o. female with no signigicant past medical history presents emergency department for treatment and evaluation of vaginal irritation and foul odor and a sore area in her mouth.  She and her female partner had intercourse together with another female 2 days ago, which included oral sex. No known STI exposure. No fever.      Physical Exam   Triage Vital Signs: ED Triage Vitals  Enc Vitals Group     BP 10/02/22 1240 (!) 157/111     Pulse Rate 10/02/22 1240 73     Resp 10/02/22 1240 18     Temp 10/02/22 1240 98.3 F (36.8 C)     Temp src --      SpO2 10/02/22 1240 97 %     Weight 10/02/22 1247 240 lb (108.9 kg)     Height 10/02/22 1247 5\' 5"  (1.651 m)     Head Circumference --      Peak Flow --      Pain Score 10/02/22 1247 10     Pain Loc --      Pain Edu? --      Excl. in GC? --     Most recent vital signs: Vitals:   10/02/22 1240 10/02/22 1712  BP: (!) 157/111 (!) 148/99  Pulse: 73 70  Resp: 18 18  Temp: 98.3 F (36.8 C)   SpO2: 97% 98%     General: Awake, no distress.  CV:  Good peripheral perfusion.  Resp:  Normal effort.  Abd:  No distention.  Other:  Thin, malodorous discharge noted in vaginal vault.  No discharge noted cervical os.  IUD strings identified.  No cervical motion tenderness on exam.   ED Results / Procedures / Treatments   Labs (all labs ordered are listed, but only abnormal results are displayed) Labs Reviewed  WET PREP, GENITAL - Abnormal; Notable for the following components:      Result Value   Clue Cells Wet Prep HPF POC PRESENT (*)    WBC, Wet Prep HPF POC >=10 (*)    All other components within normal limits  URINALYSIS, ROUTINE W REFLEX MICROSCOPIC - Abnormal; Notable for the following components:   Color, Urine  YELLOW (*)    APPearance CLOUDY (*)    Hgb urine dipstick SMALL (*)    Leukocytes,Ua TRACE (*)    Bacteria, UA RARE (*)    All other components within normal limits  CHLAMYDIA/NGC RT PCR ([facility] ONLY)            PREGNANCY, URINE  HSV 2 ANTIBODY, IGG     EKG  Not indicated.   RADIOLOGY  Not indicated.  PROCEDURES:  Critical Care performed: No  Procedures   MEDICATIONS ORDERED IN ED: Medications - No data to display   IMPRESSION / MDM / ASSESSMENT AND PLAN / ED COURSE  I reviewed the triage vital signs and the nursing notes.                              Differential diagnosis includes, but is not limited to bacterial vaginosis, chlamydia, gonorrhea, Candida, herpes  Patient's presentation is most consistent with acute illness / injury with system symptoms.  31 year old  female presenting to the emergency department for treatment and evaluation for STI concerns.  See HPI for further details.  On exam, it is likely that she has bacterial vaginosis.  Specimens for wet prep and chlamydia and gonorrhea were collected and submitted to the lab.  Patient also like to be tested for herpes.  Lab study ordered.  Wet prep is positive for bacterial vaginosis.  She will be treated with Flagyl.  She was strongly advised to avoid alcohol while taking this medication.  She was advised to check her MyChart for results of chlamydia, gonorrhea, and herpes.  She will not be empirically treated at this time.  She was encouraged to go to the STD clinic at the health department for additional concerns or if not improving.     FINAL CLINICAL IMPRESSION(S) / ED DIAGNOSES   Final diagnoses:  Bacterial vaginosis     Rx / DC Orders   ED Discharge Orders          Ordered    metroNIDAZOLE (FLAGYL) 500 MG tablet  2 times daily        10/02/22 1641             Note:  This document was prepared using Dragon voice recognition software and may include unintentional dictation errors.    Chinita Pester, FNP 10/02/22 1736    Willy Eddy, MD 10/02/22 [phone]

## 2022-10-02 NOTE — ED Triage Notes (Signed)
Pt to ED for possible STD exposure. Reports vaginal odor, vaginal itching and possible sore to mouth. States feels "off"

## 2022-10-06 ENCOUNTER — Ambulatory Visit: Payer: Medicaid Other

## 2022-11-02 ENCOUNTER — Encounter: Payer: Self-pay | Admitting: Advanced Practice Midwife

## 2022-11-02 ENCOUNTER — Ambulatory Visit (LOCAL_COMMUNITY_HEALTH_CENTER): Payer: Medicaid Other | Admitting: Family Medicine

## 2022-11-02 VITALS — BP 131/84 | Ht 61.5 in | Wt 255.8 lb

## 2022-11-02 DIAGNOSIS — Z309 Encounter for contraceptive management, unspecified: Secondary | ICD-10-CM

## 2022-11-02 DIAGNOSIS — Z01419 Encounter for gynecological examination (general) (routine) without abnormal findings: Secondary | ICD-10-CM | POA: Diagnosis not present

## 2022-11-02 DIAGNOSIS — Z30432 Encounter for removal of intrauterine contraceptive device: Secondary | ICD-10-CM | POA: Diagnosis not present

## 2022-11-02 DIAGNOSIS — Z3009 Encounter for other general counseling and advice on contraception: Secondary | ICD-10-CM

## 2022-11-02 DIAGNOSIS — Z113 Encounter for screening for infections with a predominantly sexual mode of transmission: Secondary | ICD-10-CM

## 2022-11-02 LAB — WET PREP FOR TRICH, YEAST, CLUE
Trichomonas Exam: NEGATIVE
Yeast Exam: NEGATIVE

## 2022-11-02 LAB — HM HIV SCREENING LAB: HM HIV Screening: NEGATIVE

## 2022-11-02 NOTE — Progress Notes (Signed)
[facility] Number: [phone]  Family Planning Visit- Repeat Yearly Visit  Subjective:  Miranda Fields is a 32 y.o. [id number]  being seen today for an annual wellness visit and to discuss contraception options.   The patient is currently using IUD or IUS for pregnancy prevention. Patient does want a pregnancy in the next year.   Patient has the following medical problems: has Morbid obesity ([place]) 255, BMI 47.55 on their problem list.  Chief Complaint  Patient presents with   Annual Exam    Patient reports to clinic for annual exam and to remove IUD. Patient plans a pregnancy in the next year.  Patient denies concerns about her body.   See flowsheet for other program required questions.   Body mass index is 47.55 kg/m. - Patient is eligible for diabetes screening based on BMI> 25 and age >67?  not applicable MC9O ordered? not applicable  Patient reports 2 of partners in last year. Desires STI screening?  Yes   Has patient been screened once for HCV in the past?  No  No results found for: "HCVAB"  Does the patient have current of drug use, have a partner with drug use, and/or has been incarcerated since last result? No  If yes-- Screen for HCV through [facility]   Does the patient meet criteria for HBV testing? No  Criteria:  -Household, sexual or needle sharing contact with HBV -History of drug use -HIV positive -Those with known Hep C   Health Maintenance Due  Topic Date Due   INFLUENZA VACCINE  Never done   COVID-19 Vaccine (3 - 2023-24 season) 06/30/2022    Review of Systems  Constitutional:  Negative for weight loss.  Eyes:  Negative for blurred vision.  Respiratory:  Negative for cough and shortness of breath.   Cardiovascular:  Negative for claudication.  Gastrointestinal:  Negative for nausea.  Genitourinary:  Negative for dysuria and frequency.  Skin:  Negative for  rash.  Neurological:  Negative for headaches.  Endo/Heme/Allergies:  Does not bruise/bleed easily.    The following portions of the patient's history were reviewed and updated as appropriate: allergies, current medications, past family history, past medical history, past social history, past surgical history and problem list. Problem list updated.  Objective:   Vitals:   11/02/22 1014  BP: 131/84  Weight: 255 lb 12.8 oz (116 kg)  Height: 5' 1.5" (1.562 m)    Physical Exam Vitals and nursing note reviewed.  Constitutional:      Appearance: She is obese.  HENT:     Head: Normocephalic and atraumatic.     Mouth/Throat:     Mouth: Mucous membranes are moist.     Pharynx: Oropharynx is clear. No oropharyngeal exudate or posterior oropharyngeal erythema.  Pulmonary:     Effort: Pulmonary effort is normal.  Chest:  Breasts:    Tanner Score is 5.     Right: Normal. No inverted nipple, mass, nipple discharge, skin change or tenderness.     Left: Normal. No inverted nipple, mass, nipple discharge, skin change or tenderness.  Abdominal:     Palpations: Abdomen is soft. There is no mass.     Tenderness: There is no abdominal tenderness. There is no rebound.  Genitourinary:    General: Normal vulva.     Exam position: Lithotomy position.     Pubic Area: No rash or pubic lice.      Tanner  stage (genital): 5.     Labia:        Right: No rash or lesion.        Left: No rash or lesion.      Vagina: Normal. No vaginal discharge, erythema, bleeding or lesions.     Cervix: Lesion present. No cervical motion tenderness, discharge, friability or erythema.     Uterus: Normal.      Adnexa: Right adnexa normal and left adnexa normal.     Rectum: Normal.        Comments: pH = 4  ?Nabothian cyst at 2 o'clock Lymphadenopathy:     Head:     Right side of head: No preauricular or posterior auricular adenopathy.     Left side of head: No preauricular or posterior auricular adenopathy.      Cervical: No cervical adenopathy.     Upper Body:     Right upper body: No supraclavicular, axillary or epitrochlear adenopathy.     Left upper body: No supraclavicular, axillary or epitrochlear adenopathy.     Lower Body: No right inguinal adenopathy. No left inguinal adenopathy.  Skin:    General: Skin is warm and dry.     Findings: No rash.  Neurological:     Mental Status: She is alert and oriented to person, place, and time.  Psychiatric:        Mood and Affect: Mood normal.        Behavior: Behavior normal.      Assessment and Plan:  Miranda Fields is a 32 y.o. female [phone] presenting to the [facility] Department for an yearly wellness and contraception visit  Contraception counseling: Reviewed options based on patient desire and reproductive life plan. Patient is interested in No Method - Other Reason. Patient desires pregnancy. Risks, benefits, and typical effectiveness rates were reviewed.  Questions were answered.  Written information was also given to the patient to review.    The patient will follow up in  1 years for surveillance.  The patient was told to call with any further questions, or with any concerns about this method of contraception.  Emphasized use of condoms 100% of the time for STI prevention.  Patient was assessed for need for ECP. Not indicated, patient had IUD.  1. Well woman exam with routine gynecological exam Patient desires pregnancy- discussed exercise for weight loss but also to lower BP. Pt states she had 1 high reading for BP before- attributes this to stress -CBE today (normal), next due 11/02/25 -Pap test due in 02/2023  2. Family planning Emphasized importance of PNV and abstaining for Etoh while trying to conceive.  3. Screening for venereal disease  - HIV [place] [facility] - Syphilis Serology, [place] [facility] - Chlamydia/Gonorrhea [place] [facility] - WET PREP FOR [place], YEAST, CLUE  4. Encounter for IUD removal  IUD Removal   Patient identified, informed consent performed, consent signed.  Patient was in the dorsal lithotomy position, normal external genitalia was noted.  A speculum was placed in the patient's vagina, normal discharge was noted, no lesions. The cervix was visualized, no lesions, no abnormal discharge.  The strings of the IUD were grasped and pulled using ring forceps. The IUD was removed in its entirety.  The strings of the IUD were not visualized, cytobrush was attempted which was unsuccessful so Kelly forceps were introduced into the endometrial cavity and the IUD was grasped and removed in its entirety.  Patient tolerated the procedure well.  Patient will use plans for pregnancy soon and she was told to avoid teratogens, take PNV and folic acid.  Routine preventative health maintenance measures emphasized.   Return if symptoms worsen or fail to improve.  No future appointments.  Sharlet Salina, [place]

## 2022-11-02 NOTE — Progress Notes (Addendum)
Patient here for PE and IUD removal. Last Pap 03/16/2020, NILM and HPV negative. Patient planning pregnancy in next year, declines BCM. Given Planning for Pregnancy pamphlet, and counseled to begin taking a multivitamin or PNV with 400 mcg folid acid.Marland KitchenMarland KitchenMarland KitchenJenetta Downer, RN

## 2022-11-02 NOTE — Progress Notes (Signed)
Wet prep reviewed, no treatment indicated..Efrain Clauson Brewer-Jensen, RN  

## 2022-11-29 ENCOUNTER — Emergency Department: Payer: Medicaid Other

## 2022-11-29 ENCOUNTER — Other Ambulatory Visit: Payer: Self-pay

## 2022-11-29 ENCOUNTER — Emergency Department
Admission: EM | Admit: 2022-11-29 | Discharge: 2022-11-29 | Disposition: A | Discharge status: Home / Self Care | Payer: Medicaid Other | Attending: Emergency Medicine | Admitting: Emergency Medicine

## 2022-11-29 DIAGNOSIS — R109 Unspecified abdominal pain: Secondary | ICD-10-CM | POA: Diagnosis present

## 2022-11-29 DIAGNOSIS — Z20822 Contact with and (suspected) exposure to covid-19: Secondary | ICD-10-CM | POA: Diagnosis not present

## 2022-11-29 DIAGNOSIS — R1030 Lower abdominal pain, unspecified: Secondary | ICD-10-CM

## 2022-11-29 DIAGNOSIS — R112 Nausea with vomiting, unspecified: Secondary | ICD-10-CM

## 2022-11-29 LAB — COMPREHENSIVE METABOLIC PANEL
ALT: 31 U/L (ref 0–44)
AST: 22 U/L (ref 15–41)
Albumin: 3.7 g/dL (ref 3.5–5.0)
Alkaline Phosphatase: 69 U/L (ref 38–126)
Anion gap: 9 (ref 5–15)
BUN: 16 mg/dL (ref 6–20)
CO2: 24 mmol/L (ref 22–32)
Calcium: 8.8 mg/dL — ABNORMAL LOW (ref 8.9–10.3)
Chloride: 105 mmol/L (ref 98–111)
Creatinine, Ser: 0.79 mg/dL (ref 0.44–1.00)
GFR, Estimated: >60 mL/min (ref >60)
Glucose, Bld: 125 mg/dL — ABNORMAL HIGH (ref 70–99)
Potassium: 3.9 mmol/L (ref 3.5–5.1)
Sodium: 138 mmol/L (ref 135–145)
Total Bilirubin: 0.6 mg/dL (ref 0.3–1.2)
Total Protein: 7.4 g/dL (ref 6.5–8.1)

## 2022-11-29 LAB — URINALYSIS, ROUTINE W REFLEX MICROSCOPIC
Bilirubin Urine: NEGATIVE
Glucose, UA: NEGATIVE mg/dL
Ketones, ur: NEGATIVE mg/dL
Nitrite: NEGATIVE
Protein, ur: NEGATIVE mg/dL
Specific Gravity, Urine: 1.011 (ref 1.005–1.030)
pH: 7 (ref 5.0–8.0)

## 2022-11-29 LAB — CBC
HCT: 41.5 % (ref 36.0–46.0)
Hemoglobin: 13.3 g/dL (ref 12.0–15.0)
MCH: 28.7 pg (ref 26.0–34.0)
MCHC: 32 g/dL (ref 30.0–36.0)
MCV: 89.6 fL (ref 80.0–100.0)
Platelets: 271 10*3/uL (ref 150–400)
RBC: 4.63 MIL/uL (ref 3.87–5.11)
RDW: 13.7 % (ref 11.5–15.5)
WBC: 8.3 10*3/uL (ref 4.0–10.5)
nRBC: 0 % (ref 0.0–0.2)

## 2022-11-29 LAB — RESP PANEL BY RT-PCR (RSV, FLU A&B, COVID)  RVPGX2
Influenza A by PCR: NEGATIVE
Influenza B by PCR: NEGATIVE
Resp Syncytial Virus by PCR: NEGATIVE
SARS Coronavirus 2 by RT PCR: NEGATIVE

## 2022-11-29 LAB — POC URINE PREG, ED: Preg Test, Ur: NEGATIVE

## 2022-11-29 LAB — LIPASE, BLOOD: Lipase: 36 U/L (ref 11–51)

## 2022-11-29 MED ORDER — IOHEXOL 300 MG/ML  SOLN
100.0000 mL | Freq: Once | INTRAMUSCULAR | Status: AC | PRN
  Administered 2022-11-29: 100 mL via INTRAVENOUS

## 2022-11-29 MED ORDER — KETOROLAC TROMETHAMINE 15 MG/ML IJ SOLN
15.0000 mg | Freq: Once | INTRAMUSCULAR | Status: AC
  Administered 2022-11-29: 15 mg via INTRAVENOUS
  Filled 2022-11-29: qty 1

## 2022-11-29 MED ORDER — ONDANSETRON HCL 4 MG/2ML IJ SOLN
4.0000 mg | Freq: Once | INTRAMUSCULAR | Status: AC
  Administered 2022-11-29: 4 mg via INTRAVENOUS
  Filled 2022-11-29: qty 2

## 2022-11-29 MED ORDER — ONDANSETRON 4 MG PO TBDP: 4.0000 mg | ORAL_TABLET | Freq: Three times a day (TID) | ORAL | 0 refills | Status: AC | PRN

## 2022-11-29 NOTE — ED Notes (Signed)
Pt ambulatory to hall bathroom with steady gait.

## 2022-11-29 NOTE — ED Provider Notes (Signed)
[facility] Provider Note    Event Date/Time   First MD Initiated Contact with Patient 11/29/22 1156     (approximate)   History   Abdominal Pain   HPI  Miranda Fields is a 32 y.o. female who comes in with abdominal pain nausea, vomiting over the past 2 to 3 days.  Patient denies any diarrhea although in the triage note she did report some diarrhea she is adamant that she has not had any.  She denies any history of ovarian cyst.  She has been with 1 partner denies any vaginal discharge or concerns for STDs did have recent testing done back in December which was negative.  She denies using tampons or concerns for anything being stuck in her vagina.  She reports some lower abdominal discomfort.  Reports she last vomited this morning and it was nonbloody nonbilious.   Physical Exam   Triage Vital Signs: ED Triage Vitals  Enc Vitals Group     BP 11/29/22 1134 (!) 143/76     Pulse Rate 11/29/22 1134 62     Resp 11/29/22 1134 18     Temp 11/29/22 1134 98 F (36.7 C)     Temp src --      SpO2 11/29/22 1134 99 %     Weight 11/29/22 1128 255 lb 11.7 oz (116 kg)     Height 11/29/22 1128 5' 1.5" (1.562 m)     Head Circumference --      Peak Flow --      Pain Score 11/29/22 1128 5     Pain Loc --      Pain Edu? --      Excl. in [place]? --     Most recent vital signs: Vitals:   11/29/22 1134  BP: (!) 143/76  Pulse: 62  Resp: 18  Temp: 98 F (36.7 C)  SpO2: 99%     General: Awake, no distress.  CV:  Good peripheral perfusion.  Resp:  Normal effort.  Abd:  No distention.  Slightly tender in the lower abdomen without any rebound or guarding. Other:     ED Results / Procedures / Treatments   Labs (all labs ordered are listed, but only abnormal results are displayed) Labs Reviewed  COMPREHENSIVE METABOLIC PANEL - Abnormal; Notable for the following components:      Result Value   Glucose, Bld 125 (*)    Calcium 8.8 (*)    All other  components within normal limits  URINALYSIS, ROUTINE W REFLEX MICROSCOPIC - Abnormal; Notable for the following components:   Color, Urine STRAW (*)    APPearance CLEAR (*)    Hgb urine dipstick SMALL (*)    Leukocytes,Ua SMALL (*)    Bacteria, UA RARE (*)    All other components within normal limits  LIPASE, BLOOD  CBC  POC URINE PREG, ED     RADIOLOGY I have reviewed the CT personally interpreted and no evidence of appendicitis  PROCEDURES:  Critical Care performed: No  Procedures   MEDICATIONS ORDERED IN ED: Medications - No data to display   IMPRESSION / MDM / [place] / ED COURSE  I reviewed the triage vital signs and the nursing notes.   Patient's presentation is most consistent with acute presentation with potential threat to life or bodily function.   Differential is viral, gastritis, appendicitis, ovarian pathology.  She denies any history of ovarian cyst has had ultrasounds done previously.  No abnormal bleeding.  Will get CT to better look at both ovaries and the appendix.  We discussed pelvic exam but declined.  Pregnancy test was negative.  Lipase was normal.  CMP reassuring CBC reassuring urine with squamous cells no evidence of UTI.   IMPRESSION: 1. No acute findings in the abdomen or pelvis. 2. Stable hepatic steatosis.   Discussed incidental finding on CT.  Will give some Toradol to help with pain.  Suspect most likely a viral illness.   At this time patient feels comfortable with discharge home.   FINAL CLINICAL IMPRESSION(S) / ED DIAGNOSES   Final diagnoses:  Lower abdominal pain  Nausea and vomiting, unspecified vomiting type     Rx / DC Orders   ED Discharge Orders          Ordered    ondansetron (ZOFRAN-ODT) 4 MG disintegrating tablet  Every 8 hours PRN        11/29/22 1457             Note:  This document was prepared using Dragon voice recognition software and may include unintentional dictation errors.   Vanessa Venetie, MD 11/29/22 [phone]

## 2022-11-29 NOTE — ED Notes (Signed)
Gave crackers and water for PO challenge.

## 2022-11-29 NOTE — ED Triage Notes (Signed)
C/O abd pain, nausea and vomiting x 2-3 days  AAOx3.  Skin warm and dry. NAD

## 2022-11-29 NOTE — ED Notes (Signed)
See triage note. Pt has mild abdominal pain with some NVD for 3 days. Pt walked back to treatment room and appears to be in no acute distress. Respirations are unlabored.

## 2022-11-29 NOTE — Discharge Instructions (Signed)
You can take Tylenol 1 g every 8 hours and ibuprofen 600 every 8 hours with food to try to help with any of the abdominal pain abdominal cramping.  Take the Zofran to help with nausea.  Return to the ER if develop fevers, worsening pain or any other concerns but your CT scan was reassuring so I suspect this could just be a viral illness    MPRESSION: 1. No acute findings in the abdomen or pelvis. 2. Stable hepatic steatosis.

## 2022-11-29 NOTE — ED Notes (Signed)
Pt wanted to leave to pick up son. Pt tolerated PO challenge.

## 2022-12-23 ENCOUNTER — Ambulatory Visit
Admission: EM | Admit: 2022-12-23 | Discharge: 2022-12-23 | Disposition: A | Discharge status: Home / Self Care | Payer: Medicaid Other | Attending: Emergency Medicine | Admitting: Emergency Medicine

## 2022-12-23 DIAGNOSIS — B3731 Acute candidiasis of vulva and vagina: Secondary | ICD-10-CM | POA: Diagnosis present

## 2022-12-23 DIAGNOSIS — N390 Urinary tract infection, site not specified: Secondary | ICD-10-CM | POA: Diagnosis present

## 2022-12-23 LAB — URINALYSIS, W/ REFLEX TO CULTURE (INFECTION SUSPECTED)
Bilirubin Urine: NEGATIVE
Glucose, UA: 100 mg/dL — AB
Ketones, ur: NEGATIVE mg/dL
Nitrite: POSITIVE — AB
Protein, ur: 100 mg/dL — AB
Specific Gravity, Urine: 1.02 (ref 1.005–1.030)
WBC, UA: >50 WBC/hpf (ref 0–5)
pH: 6.5 (ref 5.0–8.0)

## 2022-12-23 LAB — WET PREP, GENITAL
Clue Cells Wet Prep HPF POC: NONE SEEN
Sperm: NONE SEEN
Trich, Wet Prep: NONE SEEN
WBC, Wet Prep HPF POC: <10 — AB (ref <10)

## 2022-12-23 LAB — HCG, QUANTITATIVE, PREGNANCY: hCG, Beta Chain, Quant, S: <1 m[IU]/mL (ref <5)

## 2022-12-23 LAB — PREGNANCY, URINE: Preg Test, Ur: NEGATIVE

## 2022-12-23 MED ORDER — CEPHALEXIN 500 MG PO CAPS: 500.0000 mg | ORAL_CAPSULE | Freq: Two times a day (BID) | ORAL | 0 refills | Status: AC

## 2022-12-23 MED ORDER — MICONAZOLE NITRATE 2 % VA CREA: 1.0000 | TOPICAL_CREAM | Freq: Every day | VAGINAL | 0 refills | Status: DC

## 2022-12-23 NOTE — Discharge Instructions (Addendum)
You have tested positive for both a vaginal yeast infection and a urinary tract infection today.  For your vaginal yeast infection and when she use the Monistat cream.  You insert 1 applicatorful in your vagina at bedtime for 7 days.  For your urinary tract infection prescribe you Keflex that you take twice daily for 5 days.  Take this with food.  You need to increase your oral fluid intake such increased urine production help flush your urinary tract.  We are sending urine for culture and if need to make an adjustment to your antibiotics based on the results we will.  Your urine pregnancy test is negative but given that you are 16 days late on your menstrual cycle we will order a blood pregnancy test.  If this is positive you will receive a call but if it is negative it will be sent to your MyChart.  Your test for gonorrhea and chlamydia will be back tomorrow and if either test is positive you will receive a phone call offer you treatment options.  If both results are negative you will receive notification that you have labs in your MyChart.  If you develop any new or worsening symptoms such as fever, abdominal pain, nausea and vomiting and cannot keep down medication or fluids, or vaginal discharge or bleeding please return for reevaluation, follow-up with your OB/GYN, or seek care in the ER.

## 2022-12-23 NOTE — ED Triage Notes (Signed)
Pt states that she has been having pelvic pressure, back pain and urination frequency,  no burning or itching. Since Monday. She took a home pregnancy test that was negative. LMP was last month. No BC. Wants to be checked for STD's. Swab given.
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
## 2022-12-25 LAB — URINE CULTURE: Culture: 70000 — AB

## 2022-12-25 LAB — CERVICOVAGINAL ANCILLARY ONLY
Chlamydia: NEGATIVE
Comment: NEGATIVE
Comment: NORMAL
Neisseria Gonorrhea: NEGATIVE

## 2023-02-27 ENCOUNTER — Ambulatory Visit
Admission: EM | Admit: 2023-02-27 | Discharge: 2023-02-27 | Disposition: A | Discharge status: Home / Self Care | Payer: BLUE CROSS/BLUE SHIELD | Attending: Emergency Medicine | Admitting: Emergency Medicine

## 2023-02-27 DIAGNOSIS — Z113 Encounter for screening for infections with a predominantly sexual mode of transmission: Secondary | ICD-10-CM | POA: Diagnosis present

## 2023-02-27 LAB — URINALYSIS, W/ REFLEX TO CULTURE (INFECTION SUSPECTED)
Bilirubin Urine: NEGATIVE
Glucose, UA: NEGATIVE mg/dL
Ketones, ur: NEGATIVE mg/dL
Leukocytes,Ua: NEGATIVE
Nitrite: NEGATIVE
Protein, ur: NEGATIVE mg/dL
Specific Gravity, Urine: 1.025 (ref 1.005–1.030)
WBC, UA: NONE SEEN WBC/hpf (ref 0–5)
pH: 6 (ref 5.0–8.0)

## 2023-02-27 LAB — WET PREP, GENITAL
Clue Cells Wet Prep HPF POC: NONE SEEN
Sperm: NONE SEEN
Trich, Wet Prep: NONE SEEN
WBC, Wet Prep HPF POC: <10 — AB (ref <10)
Yeast Wet Prep HPF POC: NONE SEEN

## 2023-02-27 NOTE — Discharge Instructions (Addendum)
Your vaginal wet prep was negative for any signs of infection and your urine did not show any signs of infection either.  Your symptoms may be a result of a possible sexually transmitted infection.  Your swab results will be back tomorrow.  If you test positive you will be called by phone and treatment options will be offered.  If you test negative your results will show up in your MyChart.  If your STI panel is negative I recommend you follow-up with your primary care provider, or your OB/GYN, if your symptoms persist.

## 2023-02-27 NOTE — ED Triage Notes (Signed)
Pt presents to UC for STD testing, c/o lower back pain & lower pelvic pain x1 wk. Denies any vaginal discharge,burning or pain, just foul urine odor at times.
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
## 2023-03-01 LAB — CERVICOVAGINAL ANCILLARY ONLY
Chlamydia: NEGATIVE
Comment: NEGATIVE
Comment: NEGATIVE
Comment: NORMAL
Neisseria Gonorrhea: NEGATIVE
Trichomonas: NEGATIVE

## 2023-03-06 ENCOUNTER — Telehealth: Payer: Self-pay

## 2023-03-06 NOTE — Telephone Encounter (Signed)
LEFT VOICE MAIL EXPLAINING THAT [employer] IS SHOWING UP AS OUT OF NETWORK.

## 2023-03-07 ENCOUNTER — Ambulatory Visit: Payer: BLUE CROSS/BLUE SHIELD | Admitting: Physician Assistant

## 2023-06-15 ENCOUNTER — Ambulatory Visit
Admission: EM | Admit: 2023-06-15 | Discharge: 2023-06-15 | Disposition: A | Discharge status: Home / Self Care | Payer: BLUE CROSS/BLUE SHIELD | Attending: Physician Assistant | Admitting: Physician Assistant

## 2023-06-15 ENCOUNTER — Encounter: Payer: Self-pay | Admitting: Emergency Medicine

## 2023-06-15 DIAGNOSIS — N3000 Acute cystitis without hematuria: Secondary | ICD-10-CM | POA: Insufficient documentation

## 2023-06-15 DIAGNOSIS — N760 Acute vaginitis: Secondary | ICD-10-CM | POA: Insufficient documentation

## 2023-06-15 DIAGNOSIS — B3731 Acute candidiasis of vulva and vagina: Secondary | ICD-10-CM | POA: Insufficient documentation

## 2023-06-15 DIAGNOSIS — B9689 Other specified bacterial agents as the cause of diseases classified elsewhere: Secondary | ICD-10-CM | POA: Insufficient documentation

## 2023-06-15 LAB — URINALYSIS, W/ REFLEX TO CULTURE (INFECTION SUSPECTED)
Bilirubin Urine: NEGATIVE
Glucose, UA: NEGATIVE mg/dL
Nitrite: NEGATIVE
Protein, ur: NEGATIVE mg/dL
Specific Gravity, Urine: 1.02 (ref 1.005–1.030)
pH: 5.5 (ref 5.0–8.0)

## 2023-06-15 LAB — WET PREP, GENITAL
Sperm: NONE SEEN
Trich, Wet Prep: NONE SEEN
WBC, Wet Prep HPF POC: <10 (ref <10)

## 2023-06-15 LAB — PREGNANCY, URINE: Preg Test, Ur: NEGATIVE

## 2023-06-15 MED ORDER — METRONIDAZOLE 500 MG PO TABS: 500.0000 mg | ORAL_TABLET | Freq: Two times a day (BID) | ORAL | 0 refills | Status: AC

## 2023-06-15 MED ORDER — NITROFURANTOIN MONOHYD MACRO 100 MG PO CAPS: 100.0000 mg | ORAL_CAPSULE | Freq: Two times a day (BID) | ORAL | 0 refills | Status: DC

## 2023-06-15 MED ORDER — FLUCONAZOLE 150 MG PO TABS: ORAL_TABLET | ORAL | 0 refills | Status: DC

## 2023-06-15 NOTE — ED Triage Notes (Signed)
Patient here for STD testing.  Patient reports pain in her lower back and abdomen for 4 days.

## 2023-06-15 NOTE — Discharge Instructions (Addendum)
-  STI testing obtained.  Results will come through MyChart in the next 24 to 48 hours.  If anything is positive we will recommend treatment.  Would need to abstain from sexual intercourse until 1 week after you have completed therapy.  For any acute worsening of pain you should go to the ER.  The most common types of vaginal infections are yeast infections and bacterial vaginosis. Neither of which are really considered to be sexually transmitted. Often a pH swab or wet prep is performed and if abnormal may reveal either type of infection. Begin metronidazole if prescribed for possible BV infection. If there is concern for yeast infection, fluconazole is often prescribed . Take this as directed. You may also apply topical miconazole (can be purchased OTC) externally for relief of itching. Increase rest and fluid intake. If labs sent out, we will call within 2-5 days with results and amend treatment if necessary. Always try to use pH balanced washes/wipes, urinate after intercourse, stay hydrated, and take probiotics if you are prone to vaginal infections. Return or see PCP or gynecologist for new/worsening infections.    UTI: Based on either symptoms or urinalysis, you may have a urinary tract infection. We will send the urine for culture and call with results in a few days. Begin antibiotics at this time. Your symptoms should be much improved over the next 2-3 days. Increase rest and fluid intake. If for some reason symptoms are worsening or not improving after a couple of days or the urine culture determines the antibiotics you are taking will not treat the infection, the antibiotics may be changed. Return or go to ER for fever, back pain, worsening urinary pain, discharge, increased blood in urine. May take Tylenol or Motrin OTC for pain relief or consider AZO if no contraindications
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
## 2023-06-16 LAB — URINE CULTURE

## 2023-06-18 LAB — CERVICOVAGINAL ANCILLARY ONLY
Chlamydia: NEGATIVE
Comment: NEGATIVE
Comment: NEGATIVE
Comment: NORMAL
Neisseria Gonorrhea: NEGATIVE
Trichomonas: NEGATIVE

## 2023-07-17 ENCOUNTER — Ambulatory Visit
Admission: EM | Admit: 2023-07-17 | Discharge: 2023-07-17 | Disposition: A | Discharge status: Home / Self Care | Payer: BLUE CROSS/BLUE SHIELD | Attending: Emergency Medicine | Admitting: Emergency Medicine

## 2023-07-17 DIAGNOSIS — N3001 Acute cystitis with hematuria: Secondary | ICD-10-CM | POA: Insufficient documentation

## 2023-07-17 DIAGNOSIS — J069 Acute upper respiratory infection, unspecified: Secondary | ICD-10-CM | POA: Diagnosis present

## 2023-07-17 DIAGNOSIS — Z20822 Contact with and (suspected) exposure to covid-19: Secondary | ICD-10-CM | POA: Diagnosis present

## 2023-07-17 DIAGNOSIS — Z3202 Encounter for pregnancy test, result negative: Secondary | ICD-10-CM | POA: Insufficient documentation

## 2023-07-17 LAB — URINALYSIS, W/ REFLEX TO CULTURE (INFECTION SUSPECTED)
Bilirubin Urine: NEGATIVE
Glucose, UA: NEGATIVE mg/dL
Ketones, ur: NEGATIVE mg/dL
Nitrite: NEGATIVE
Protein, ur: NEGATIVE mg/dL
Specific Gravity, Urine: 1.02 (ref 1.005–1.030)
pH: 6 (ref 5.0–8.0)

## 2023-07-17 LAB — GROUP A STREP BY PCR: Group A Strep by PCR: NOT DETECTED

## 2023-07-17 LAB — SARS CORONAVIRUS 2 BY RT PCR: SARS Coronavirus 2 by RT PCR: NEGATIVE

## 2023-07-17 LAB — PREGNANCY, URINE: Preg Test, Ur: NEGATIVE

## 2023-07-17 MED ORDER — FLUTICASONE PROPIONATE 50 MCG/ACT NA SUSP: 2.0000 | Freq: Every day | NASAL | 0 refills | Status: DC

## 2023-07-17 MED ORDER — NAPROXEN 500 MG PO TABS: 500.0000 mg | ORAL_TABLET | Freq: Two times a day (BID) | ORAL | 0 refills | Status: DC

## 2023-07-17 MED ORDER — PROMETHAZINE-DM 6.25-15 MG/5ML PO SYRP: 5.0000 mL | ORAL_SOLUTION | Freq: Four times a day (QID) | ORAL | 0 refills | Status: DC | PRN

## 2023-07-17 MED ORDER — CEPHALEXIN 500 MG PO CAPS: 500.0000 mg | ORAL_CAPSULE | Freq: Two times a day (BID) | ORAL | 0 refills | Status: DC

## 2023-07-17 NOTE — ED Triage Notes (Signed)
Sx started yesterday. Sore throat,coughing,   Stomach tightness. Period came on Sunday and went off. She's been off BC since last year and periods havent regulated.

## 2023-07-17 NOTE — Discharge Instructions (Addendum)
We have sent your urine off for culture to confirm antibiotic choice and the provisional diagnosis of urinary tract infection.  Make sure you drink plenty of extra fluids.  Take the Naprosyn with 1000 mg of Tylenol together twice a day.  Flonase, saline nasal irrigation with a Lloyd Huger Med rinse and distilled water as often as you want, Mucinex D for the nasal congestion, postnasal drip.  Promethazine DM as needed for cough.  Try 5 mL of children's liquid Benadryl mixed with 5 mL of Maalox 4 times a day.  Gargle and swallow.  This will help your throat.  You can also try some Pepcid to help with any GERD symptoms, which can also cause a sore throat.  Here is a list of primary care providers who are taking new patients:  [facility] [place] Dr. Joseph Berkshire (sports medicine) Dr. Elizabeth Sauer [address] [place] [address] [phone]  [facility] at [facility] [address] [place], [place] [address] [phone]  [facility] [place] [address]  [place] [address]  [phone]  [facility] [address] [place], [place] [address] [phone]  [facility] [address] [place]  [phone] [place], [place] [address]  Here are clinics/ other resources who will see you if you do not have insurance. Some have certain criteria that you must meet. Call them and find out what they are:  [facility]: [address] [place], [place] [address] Phone: [phone] Hours: First and Third Saturdays of each Month, 9 a.m. - 1 p.m.  [facility]: [address] [place], [place] [address] Phone: [phone] Hours: Tuesday, 4 p.m. - 8 p.m. Thursday, 1 p.m. - 8 p.m. Wednesday, 9 a.m. - [facility] [address], [place], [place] [address] Phone: [phone] Pharmacy Phone Number: [phone] Dental Phone Number: [phone] [facility] Insurance Help: [phone]  Dental Hours: Monday - Thursday, 8  a.m. - 6 p.m.  [facility] [address], [place], [place] [address] Phone: [phone] Pharmacy Phone Number: [phone] [facility] Insurance Help: [phone]  [facility] [address] [place] [place], [place] [address] Phone: [phone] Pharmacy Phone Number: [phone] [facility] Insurance Help: [phone]  [facility] [address] [place], [place] [address] Phone: [phone] [facility] Insurance Help: [phone]   [facility] [address] [place], [place] [address] Phone: [phone]  Go to www.goodrx.com  or www.costplusdrugs.com to look up your medications. This will give you a list of where you can find your prescriptions at the most affordable prices. Or ask the pharmacist what the cash price is, or if they have any other discount programs available to help make your medication more affordable. This can be less expensive than what you would pay with insurance.
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
## 2023-07-19 LAB — URINE CULTURE

## 2023-11-12 ENCOUNTER — Ambulatory Visit
Admission: EM | Admit: 2023-11-12 | Discharge: 2023-11-12 | Disposition: A | Discharge status: Home / Self Care | Payer: BLUE CROSS/BLUE SHIELD | Attending: Emergency Medicine | Admitting: Emergency Medicine

## 2023-11-12 ENCOUNTER — Encounter: Payer: Self-pay | Admitting: Emergency Medicine

## 2023-11-12 DIAGNOSIS — B9689 Other specified bacterial agents as the cause of diseases classified elsewhere: Secondary | ICD-10-CM | POA: Diagnosis not present

## 2023-11-12 DIAGNOSIS — N760 Acute vaginitis: Secondary | ICD-10-CM | POA: Diagnosis not present

## 2023-11-12 DIAGNOSIS — R030 Elevated blood-pressure reading, without diagnosis of hypertension: Secondary | ICD-10-CM | POA: Insufficient documentation

## 2023-11-12 DIAGNOSIS — Z113 Encounter for screening for infections with a predominantly sexual mode of transmission: Secondary | ICD-10-CM | POA: Diagnosis not present

## 2023-11-12 LAB — URINALYSIS, W/ REFLEX TO CULTURE (INFECTION SUSPECTED)
Bilirubin Urine: NEGATIVE
Glucose, UA: NEGATIVE mg/dL
Ketones, ur: NEGATIVE mg/dL
Leukocytes,Ua: NEGATIVE
Nitrite: NEGATIVE
Protein, ur: NEGATIVE mg/dL
Specific Gravity, Urine: 1.025 (ref 1.005–1.030)
pH: 6 (ref 5.0–8.0)

## 2023-11-12 LAB — WET PREP, GENITAL
Sperm: NONE SEEN
Trich, Wet Prep: NONE SEEN
WBC, Wet Prep HPF POC: >10 — AB (ref <10)
Yeast Wet Prep HPF POC: NONE SEEN

## 2023-11-12 LAB — PREGNANCY, URINE: Preg Test, Ur: NEGATIVE

## 2023-11-12 MED ORDER — METRONIDAZOLE 500 MG PO TABS: 500.0000 mg | ORAL_TABLET | Freq: Two times a day (BID) | ORAL | 0 refills | Status: AC
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
## 2023-11-12 NOTE — ED Triage Notes (Addendum)
 Sx x 4 days  Frequent urination Vaginal odor No discharge Back pain LMP 10/29/2023

## 2023-11-12 NOTE — Discharge Instructions (Signed)
 Your HIV and syphilis testing will be back in several days.  Your wet prep was positive for BV today.  Finish the Flagyl , even if you feel better.  Refrain from intercourse until all of your labs have been resulted, you are feeling better and your partner has been treated if necessary.  Your urine pregnancy is also negative today.  Decrease your salt intake. diet and exercise will lower your blood pressure significantly. It is important to keep your blood pressure under good control, as having a elevated blood pressure for prolonged periods of time significantly increases your risk of stroke, heart attacks, kidney damage, eye damage, and other problems. Get a validated blood pressure cuff that goes on your arm, not your wrist.  Measure your blood pressure once a day, preferably at the same time every day. Keep a log of this and bring it to your next doctor's appointment.  Bring your blood pressure cuff as well.  Return here in 2 weeks for blood pressure recheck if you're unable to get in with your primary care physician by then. Return immediately to the ER if you start having chest pain, headache, problems seeing, problems talking, problems walking, if you feel like you're about to pass out, if you do pass out, if you have a seizure, or for any other concerns.  Go to www.goodrx.com  or www.costplusdrugs.com to look up your medications. This will give you a list of where you can find your prescriptions at the most affordable prices. Or ask the pharmacist what the cash price is, or if they have any other discount programs available to help make your medication more affordable. This can be less expensive than what you would pay with insurance.

## 2023-11-13 LAB — CERVICOVAGINAL ANCILLARY ONLY
Chlamydia: NEGATIVE
Comment: NEGATIVE
Comment: NEGATIVE
Comment: NORMAL
Neisseria Gonorrhea: NEGATIVE
Trichomonas: NEGATIVE

## 2023-11-13 LAB — HIV ANTIBODY (ROUTINE TESTING W REFLEX): HIV Screen 4th Generation wRfx: NONREACTIVE

## 2023-11-14 LAB — RPR: RPR Ser Ql: NONREACTIVE

## 2023-11-19 ENCOUNTER — Ambulatory Visit: Payer: BLUE CROSS/BLUE SHIELD | Admitting: Physician Assistant

## 2023-12-13 ENCOUNTER — Ambulatory Visit
Admission: EM | Admit: 2023-12-13 | Discharge: 2023-12-13 | Disposition: A | Discharge status: Home / Self Care | Payer: BLUE CROSS/BLUE SHIELD | Attending: Family Medicine | Admitting: Family Medicine

## 2023-12-13 DIAGNOSIS — Z202 Contact with and (suspected) exposure to infections with a predominantly sexual mode of transmission: Secondary | ICD-10-CM | POA: Diagnosis not present

## 2023-12-13 DIAGNOSIS — R112 Nausea with vomiting, unspecified: Secondary | ICD-10-CM | POA: Insufficient documentation

## 2023-12-13 DIAGNOSIS — R051 Acute cough: Secondary | ICD-10-CM | POA: Diagnosis present

## 2023-12-13 DIAGNOSIS — R197 Diarrhea, unspecified: Secondary | ICD-10-CM | POA: Insufficient documentation

## 2023-12-13 DIAGNOSIS — A084 Viral intestinal infection, unspecified: Secondary | ICD-10-CM | POA: Insufficient documentation

## 2023-12-13 LAB — RESP PANEL BY RT-PCR (FLU A&B, COVID) ARPGX2
Influenza A by PCR: NEGATIVE
Influenza B by PCR: NEGATIVE
SARS Coronavirus 2 by RT PCR: NEGATIVE

## 2023-12-13 MED ORDER — ONDANSETRON HCL 4 MG/2ML IJ SOLN
4.0000 mg | Freq: Once | INTRAMUSCULAR | Status: AC
  Administered 2023-12-13: 4 mg via INTRAMUSCULAR

## 2023-12-13 MED ORDER — ONDANSETRON 4 MG PO TBDP
4.0000 mg | ORAL_TABLET | Freq: Three times a day (TID) | ORAL | 0 refills | Status: DC | PRN
Start: 2023-12-13 — End: 2024-06-05

## 2023-12-13 NOTE — ED Triage Notes (Signed)
Sx started this morning  Vomiting Diarrhea Headache Bodyache  Wants to be check be checked checked for STD.
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
## 2023-12-13 NOTE — Discharge Instructions (Addendum)
Your COVID test are negative.  I suspect that you have a viral illness.  Stop by the pharmacy to pick up your Zofran.  It is important that you stay hydrated.  See handout on viral gastroenteritis attached.

## 2023-12-14 LAB — CERVICOVAGINAL ANCILLARY ONLY
Chlamydia: NEGATIVE
Comment: NEGATIVE
Comment: NEGATIVE
Comment: NORMAL
Neisseria Gonorrhea: NEGATIVE
Trichomonas: NEGATIVE

## 2024-01-01 ENCOUNTER — Ambulatory Visit
Admission: EM | Admit: 2024-01-01 | Discharge: 2024-01-01 | Disposition: A | Discharge status: Home / Self Care | Attending: Emergency Medicine | Admitting: Emergency Medicine

## 2024-01-01 DIAGNOSIS — N898 Other specified noninflammatory disorders of vagina: Secondary | ICD-10-CM | POA: Insufficient documentation

## 2024-01-01 DIAGNOSIS — R1030 Lower abdominal pain, unspecified: Secondary | ICD-10-CM | POA: Insufficient documentation

## 2024-01-01 DIAGNOSIS — J020 Streptococcal pharyngitis: Secondary | ICD-10-CM | POA: Insufficient documentation

## 2024-01-01 LAB — POCT URINALYSIS DIP (MANUAL ENTRY)
Bilirubin, UA: NEGATIVE
Glucose, UA: NEGATIVE mg/dL
Ketones, POC UA: NEGATIVE mg/dL
Nitrite, UA: NEGATIVE
Protein Ur, POC: NEGATIVE mg/dL
Spec Grav, UA: 1.025
Urobilinogen, UA: 0.2 U/dL
pH, UA: 6.5

## 2024-01-01 LAB — POCT URINE PREGNANCY: Preg Test, Ur: NEGATIVE

## 2024-01-01 LAB — POCT RAPID STREP A (OFFICE): Rapid Strep A Screen: POSITIVE — AB

## 2024-01-01 MED ORDER — AMOXICILLIN 500 MG PO CAPS: 500.0000 mg | ORAL_CAPSULE | Freq: Two times a day (BID) | ORAL | 0 refills | Status: AC

## 2024-01-01 NOTE — ED Triage Notes (Signed)
 Patient presents to [facility] for STD screening. Reports vaginal odor, flank pain, abdominal pain x 1 week. Also reports headache x 2 days. States she has been taking goody powder for the HA.
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
## 2024-01-01 NOTE — Discharge Instructions (Addendum)
 The strep test is positive.  Take the amoxicillin as directed.    Urine culture is pending.  Urine pregnancy negative.    Your vaginal tests are pending.    Follow-up with your primary care provider.

## 2024-01-02 LAB — CERVICOVAGINAL ANCILLARY ONLY
Bacterial Vaginitis (gardnerella): NEGATIVE
Candida Glabrata: NEGATIVE
Candida Vaginitis: POSITIVE — AB
Chlamydia: NEGATIVE
Comment: NEGATIVE
Comment: NEGATIVE
Comment: NEGATIVE
Comment: NEGATIVE
Comment: NEGATIVE
Comment: NORMAL
Neisseria Gonorrhea: NEGATIVE
Trichomonas: NEGATIVE

## 2024-01-02 LAB — URINE CULTURE

## 2024-01-03 ENCOUNTER — Telehealth: Payer: Self-pay

## 2024-01-03 MED ORDER — FLUCONAZOLE 150 MG PO TABS: 150.0000 mg | ORAL_TABLET | Freq: Once | ORAL | 0 refills | Status: AC

## 2024-01-03 NOTE — Telephone Encounter (Signed)
 Per protocol, pt requires tx with Diflucan.  Rx sent to pharmacy on file.

## 2024-01-25 ENCOUNTER — Emergency Department: Payer: Self-pay

## 2024-01-25 ENCOUNTER — Other Ambulatory Visit: Payer: Self-pay

## 2024-01-25 ENCOUNTER — Emergency Department
Admission: EM | Admit: 2024-01-25 | Discharge: 2024-01-25 | Disposition: A | Discharge status: Home / Self Care | Payer: Self-pay | Attending: Emergency Medicine | Admitting: Emergency Medicine

## 2024-01-25 DIAGNOSIS — Y92009 Unspecified place in unspecified non-institutional (private) residence as the place of occurrence of the external cause: Secondary | ICD-10-CM | POA: Insufficient documentation

## 2024-01-25 DIAGNOSIS — S0003XA Contusion of scalp, initial encounter: Secondary | ICD-10-CM

## 2024-01-25 DIAGNOSIS — S01511A Laceration without foreign body of lip, initial encounter: Secondary | ICD-10-CM | POA: Insufficient documentation

## 2024-01-25 DIAGNOSIS — Z23 Encounter for immunization: Secondary | ICD-10-CM | POA: Diagnosis not present

## 2024-01-25 DIAGNOSIS — S0101XA Laceration without foreign body of scalp, initial encounter: Secondary | ICD-10-CM | POA: Diagnosis not present

## 2024-01-25 DIAGNOSIS — S0083XA Contusion of other part of head, initial encounter: Secondary | ICD-10-CM

## 2024-01-25 DIAGNOSIS — S0990XA Unspecified injury of head, initial encounter: Secondary | ICD-10-CM | POA: Diagnosis present

## 2024-01-25 DIAGNOSIS — F10920 Alcohol use, unspecified with intoxication, uncomplicated: Secondary | ICD-10-CM | POA: Insufficient documentation

## 2024-01-25 DIAGNOSIS — F10929 Alcohol use, unspecified with intoxication, unspecified: Secondary | ICD-10-CM

## 2024-01-25 MED ORDER — ONDANSETRON HCL 4 MG/2ML IJ SOLN
4.0000 mg | INTRAMUSCULAR | Status: AC
  Administered 2024-01-25: 4 mg via INTRAVENOUS
  Filled 2024-01-25: qty 2

## 2024-01-25 MED ORDER — CEFAZOLIN SODIUM-DEXTROSE 2-4 GM/100ML-% IV SOLN
2.0000 g | Freq: Once | INTRAVENOUS | Status: AC
  Administered 2024-01-25: 2 g via INTRAVENOUS
  Filled 2024-01-25: qty 100

## 2024-01-25 MED ORDER — HYDROCODONE-ACETAMINOPHEN 5-325 MG PO TABS: 2.0000 | ORAL_TABLET | Freq: Four times a day (QID) | ORAL | 0 refills | Status: DC | PRN

## 2024-01-25 MED ORDER — CEFADROXIL 500 MG PO CAPS: 500.0000 mg | ORAL_CAPSULE | Freq: Two times a day (BID) | ORAL | 0 refills | Status: AC

## 2024-01-25 MED ORDER — MORPHINE SULFATE (PF) 4 MG/ML IV SOLN
4.0000 mg | Freq: Once | INTRAVENOUS | Status: AC
  Administered 2024-01-25: 4 mg via INTRAVENOUS
  Filled 2024-01-25: qty 1

## 2024-01-25 MED ORDER — TETANUS-DIPHTH-ACELL PERTUSSIS 5-2.5-18.5 LF-MCG/0.5 IM SUSY
0.5000 mL | PREFILLED_SYRINGE | Freq: Once | INTRAMUSCULAR | Status: AC
  Administered 2024-01-25: 0.5 mL via INTRAMUSCULAR
  Filled 2024-01-25: qty 0.5

## 2024-01-25 MED ORDER — LIDOCAINE-EPINEPHRINE 2 %-1:100000 IJ SOLN
20.0000 mL | Freq: Once | INTRAMUSCULAR | Status: AC
  Administered 2024-01-25: 20 mL
  Filled 2024-01-25: qty 1

## 2024-01-25 NOTE — ED Notes (Signed)
 [facility] at bedside.

## 2024-01-25 NOTE — ED Notes (Signed)
Lidocaine and suture cart at bedside.  
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
## 2024-01-25 NOTE — ED Notes (Signed)
 Head was wrapped with kerlex with pressure dressing.

## 2024-01-25 NOTE — ED Notes (Signed)
 [place] PD called and are on their way to take pt's statement.
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
## 2024-01-25 NOTE — Discharge Instructions (Addendum)
 As we discussed, your evaluation was reassuring despite the large laceration and contusion to your head.  Your CT scans were normal.  Your laceration will take at least a week to heal, and you should schedule a follow-up appointment for about a week from today.  The doctor or provider that you see may decide the sutures need to stay on longer, but you should have it reassessed along that time.  You can shower and wash your hair normally, keep the wound clean and dry, use a thin layer of antibiotic ointment if you want to, and cover it with gauze if you would like to do so but change the dressing twice a day.  You could also use ice packs on the area to help with the pain and swelling.  You can use over-the-counter ibuprofen and/or Tylenol as needed according to label instructions.  For the short-term, Take Norco as prescribed for severe pain. Do not drink alcohol, drive or participate in any other potentially dangerous activities while taking this medication as it may make you sleepy. Do not take this medication with any other sedating medications, either prescription or over-the-counter. If you were prescribed Percocet or Vicodin, do not take these with acetaminophen (Tylenol) as it is already contained within these medications.   This medication is an opiate (or narcotic) pain medication and can be habit forming.  Use it as little as possible to achieve adequate pain control.  Do not use or use it with extreme caution if you have a history of opiate abuse or dependence.  If you are on a pain contract with your primary care doctor or a pain specialist, be sure to let them know you were prescribed this medication today from the [facility].  This medication is intended for your use only - do not give any to anyone else and keep it in a secure place where nobody else, especially children, have access to it.  It will also cause or worsen constipation, so you may want to consider taking  an over-the-counter stool softener while you are taking this medication.  Please take the full course of antibiotics prescribed.  If you develop significant swelling, redness, pus, if the wound reopens, or if you develop any new or worsening symptoms that concern you, please return immediately to the emergency department.
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
## 2024-02-01 ENCOUNTER — Ambulatory Visit: Payer: Self-pay

## 2024-02-05 ENCOUNTER — Ambulatory Visit
Admission: RE | Admit: 2024-02-05 | Discharge: 2024-02-05 | Disposition: A | Discharge status: Home / Self Care | Source: Ambulatory Visit | Attending: Internal Medicine | Admitting: Internal Medicine

## 2024-02-05 VITALS — BP 157/101 | HR 59 | Temp 98.6°F | Resp 16

## 2024-02-05 DIAGNOSIS — S0990XA Unspecified injury of head, initial encounter: Secondary | ICD-10-CM

## 2024-02-05 DIAGNOSIS — Z4802 Encounter for removal of sutures: Secondary | ICD-10-CM | POA: Diagnosis not present

## 2024-02-05 NOTE — ED Triage Notes (Signed)
 Patient is here for suture removal 8 sutures on her head. No concerns. Area is still swollen.
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
## 2024-04-22 ENCOUNTER — Ambulatory Visit: Payer: Self-pay

## 2024-05-29 ENCOUNTER — Ambulatory Visit

## 2024-06-05 ENCOUNTER — Ambulatory Visit
Admission: RE | Admit: 2024-06-05 | Discharge: 2024-06-05 | Disposition: A | Discharge status: Home / Self Care | Payer: Self-pay | Source: Ambulatory Visit | Attending: Physician Assistant | Admitting: Physician Assistant

## 2024-06-05 VITALS — BP 172/98 | HR 59 | Temp 98.6°F | Resp 18

## 2024-06-05 DIAGNOSIS — N898 Other specified noninflammatory disorders of vagina: Secondary | ICD-10-CM | POA: Insufficient documentation

## 2024-06-05 DIAGNOSIS — N760 Acute vaginitis: Secondary | ICD-10-CM | POA: Insufficient documentation

## 2024-06-05 DIAGNOSIS — Z113 Encounter for screening for infections with a predominantly sexual mode of transmission: Secondary | ICD-10-CM | POA: Insufficient documentation

## 2024-06-05 DIAGNOSIS — N3000 Acute cystitis without hematuria: Secondary | ICD-10-CM | POA: Insufficient documentation

## 2024-06-05 LAB — URINALYSIS, W/ REFLEX TO CULTURE (INFECTION SUSPECTED)
Bilirubin Urine: NEGATIVE
Glucose, UA: NEGATIVE mg/dL
Ketones, ur: NEGATIVE mg/dL
Nitrite: NEGATIVE
Protein, ur: NEGATIVE mg/dL
Specific Gravity, Urine: 1.02 (ref 1.005–1.030)
pH: 7 (ref 5.0–8.0)

## 2024-06-05 MED ORDER — NITROFURANTOIN MONOHYD MACRO 100 MG PO CAPS: 100.0000 mg | ORAL_CAPSULE | Freq: Two times a day (BID) | ORAL | 0 refills | Status: AC

## 2024-06-05 MED ORDER — FLUCONAZOLE 150 MG PO TABS: ORAL_TABLET | ORAL | 0 refills | Status: DC

## 2024-06-05 NOTE — Discharge Instructions (Addendum)
 UTI: Based on either symptoms or urinalysis, you may have a urinary tract infection. We will send the urine for culture and call with results in a few days. Begin antibiotics at this time. Your symptoms should be much improved over the next 2-3 days. Increase rest and fluid intake. If for some reason symptoms are worsening or not improving after a couple of days or the urine culture determines the antibiotics you are taking will not treat the infection, the antibiotics may be changed. Return or go to ER for fever, back pain, worsening urinary pain, discharge, increased blood in urine. May take Tylenol or Motrin OTC for pain relief or consider AZO if no contraindications   The most common types of vaginal infections are yeast infections and bacterial vaginosis. Neither of which are really considered to be sexually transmitted. Often a pH swab or wet prep is performed and if abnormal may reveal either type of infection. Begin metronidazole if prescribed for possible BV infection. If there is concern for yeast infection, fluconazole is often prescribed . Take this as directed. You may also apply topical miconazole (can be purchased OTC) externally for relief of itching. Increase rest and fluid intake. If labs sent out, we will call within 2-5 days with results and amend treatment if necessary. Always try to use pH balanced washes/wipes, urinate after intercourse, stay hydrated, and take probiotics if you are prone to vaginal infections. Return or see PCP or gynecologist for new/worsening infections.

## 2024-06-05 NOTE — ED Triage Notes (Signed)
 Sx x 4 days  Dysuria Vagina discharge Vaginal itching
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
## 2024-06-06 ENCOUNTER — Ambulatory Visit (HOSPITAL_COMMUNITY): Payer: Self-pay

## 2024-06-06 LAB — CERVICOVAGINAL ANCILLARY ONLY
Bacterial Vaginitis (gardnerella): NEGATIVE
Candida Glabrata: NEGATIVE
Candida Vaginitis: POSITIVE — AB
Chlamydia: NEGATIVE
Comment: NEGATIVE
Comment: NEGATIVE
Comment: NEGATIVE
Comment: NEGATIVE
Comment: NEGATIVE
Comment: NORMAL
Neisseria Gonorrhea: NEGATIVE
Trichomonas: NEGATIVE

## 2024-06-06 LAB — URINE CULTURE

## 2024-07-18 ENCOUNTER — Emergency Department: Payer: Self-pay

## 2024-07-18 ENCOUNTER — Other Ambulatory Visit: Payer: Self-pay

## 2024-07-18 ENCOUNTER — Encounter: Payer: Self-pay | Admitting: Emergency Medicine

## 2024-07-18 ENCOUNTER — Emergency Department
Admission: EM | Admit: 2024-07-18 | Discharge: 2024-07-18 | Disposition: A | Discharge status: Home / Self Care | Payer: Self-pay | Attending: Emergency Medicine | Admitting: Emergency Medicine

## 2024-07-18 DIAGNOSIS — R1011 Right upper quadrant pain: Secondary | ICD-10-CM | POA: Insufficient documentation

## 2024-07-18 DIAGNOSIS — I10 Essential (primary) hypertension: Secondary | ICD-10-CM | POA: Insufficient documentation

## 2024-07-18 DIAGNOSIS — R109 Unspecified abdominal pain: Secondary | ICD-10-CM

## 2024-07-18 DIAGNOSIS — R112 Nausea with vomiting, unspecified: Secondary | ICD-10-CM | POA: Insufficient documentation

## 2024-07-18 DIAGNOSIS — K7689 Other specified diseases of liver: Secondary | ICD-10-CM

## 2024-07-18 LAB — CBC
HCT: 36.8 % (ref 36.0–46.0)
Hemoglobin: 12.3 g/dL (ref 12.0–15.0)
MCH: 29.5 pg (ref 26.0–34.0)
MCHC: 33.4 g/dL (ref 30.0–36.0)
MCV: 88.2 fL (ref 80.0–100.0)
Platelets: 273 K/uL (ref 150–400)
RBC: 4.17 MIL/uL (ref 3.87–5.11)
RDW: 13.6 % (ref 11.5–15.5)
WBC: 8.7 K/uL (ref 4.0–10.5)
nRBC: 0 % (ref 0.0–0.2)

## 2024-07-18 LAB — URINALYSIS, ROUTINE W REFLEX MICROSCOPIC
Bilirubin Urine: NEGATIVE
Glucose, UA: NEGATIVE mg/dL
Ketones, ur: NEGATIVE mg/dL
Nitrite: NEGATIVE
Protein, ur: NEGATIVE mg/dL
Specific Gravity, Urine: 1.027 (ref 1.005–1.030)
pH: 5 (ref 5.0–8.0)

## 2024-07-18 LAB — COMPREHENSIVE METABOLIC PANEL WITH GFR
ALT: 18 U/L (ref 0–44)
AST: 26 U/L (ref 15–41)
Albumin: 3.6 g/dL (ref 3.5–5.0)
Alkaline Phosphatase: 68 U/L (ref 38–126)
Anion gap: 11 (ref 5–15)
BUN: 15 mg/dL (ref 6–20)
CO2: 23 mmol/L (ref 22–32)
Calcium: 8.6 mg/dL — ABNORMAL LOW (ref 8.9–10.3)
Chloride: 106 mmol/L (ref 98–111)
Creatinine, Ser: 0.93 mg/dL (ref 0.44–1.00)
GFR, Estimated: >60 mL/min (ref >60)
Glucose, Bld: 100 mg/dL — ABNORMAL HIGH (ref 70–99)
Potassium: 3.2 mmol/L — ABNORMAL LOW (ref 3.5–5.1)
Sodium: 140 mmol/L (ref 135–145)
Total Bilirubin: 0.7 mg/dL (ref 0.0–1.2)
Total Protein: 7 g/dL (ref 6.5–8.1)

## 2024-07-18 LAB — LIPASE, BLOOD: Lipase: 27 U/L (ref 11–51)

## 2024-07-18 LAB — POC URINE PREG, ED: Preg Test, Ur: NEGATIVE

## 2024-07-18 MED ORDER — ONDANSETRON 4 MG PO TBDP: 4.0000 mg | ORAL_TABLET | Freq: Three times a day (TID) | ORAL | 0 refills | Status: AC | PRN

## 2024-07-18 MED ORDER — ONDANSETRON HCL 4 MG/2ML IJ SOLN
4.0000 mg | Freq: Once | INTRAMUSCULAR | Status: AC
  Administered 2024-07-18: 4 mg via INTRAVENOUS
  Filled 2024-07-18: qty 2

## 2024-07-18 MED ORDER — SODIUM CHLORIDE 0.9 % IV BOLUS
1000.0000 mL | Freq: Once | INTRAVENOUS | Status: AC
  Administered 2024-07-18: 1000 mL via INTRAVENOUS

## 2024-07-18 NOTE — ED Triage Notes (Signed)
 Pt arrives POV c/o generalized abd pain and lower back pain for 2 days; denies urinary symptoms. Pt states she ate [place] around 2pm yesterday and vomited; tried to eat again tonight at work and vomited again. NADN.

## 2024-07-18 NOTE — ED Provider Notes (Signed)
 [facility] Provider Note    Event Date/Time   First MD Initiated Contact with Patient 07/18/24 0335     (approximate)   History   Abdominal Pain, Back Pain, and Nausea   HPI  Miranda Fields is a 33 y.o. female   Past medical history of no significant past medical history presents emerged department with 2 days of cramping abdominal pain and then nausea and vomiting today.    Poor p.o. intake over the last couple of days.  No GI bleeding.  Bowel movements have been normal.  No urinary symptoms.  External Medical Documents Reviewed: Prior outpatient notes      Physical Exam   Triage Vital Signs: ED Triage Vitals  Encounter Vitals Group     BP 07/18/24 0222 (!) 165/105     Girls Systolic BP Percentile --      Girls Diastolic BP Percentile --      Boys Systolic BP Percentile --      Boys Diastolic BP Percentile --      Pulse Rate 07/18/24 0222 71     Resp 07/18/24 0222 18     Temp 07/18/24 0222 98.5 F (36.9 C)     Temp Source 07/18/24 0222 Oral     SpO2 07/18/24 0222 97 %     Weight 07/18/24 0223 250 lb (113.4 kg)     Height 07/18/24 0223 5' (1.524 m)     Head Circumference --      Peak Flow --      Pain Score 07/18/24 0229 8     Pain Loc --      Pain Education --      Exclude from Growth Chart --     Most recent vital signs: Vitals:   07/18/24 0222 07/18/24 0229  BP: (!) 165/105   Pulse: 71   Resp: 18   Temp: 98.5 F (36.9 C)   SpO2: 97% 97%    General: Awake, no distress.  CV:  Good peripheral perfusion.  Resp:  Normal effort.  Abd:  No distention.  Other:  Right upper quadrant tenderness, mild without rigidity or guarding.  Other quadrants soft and benign.  Well-appearing hypertensive otherwise vital signs are normal afebrile.   ED Results / Procedures / Treatments   Labs (all labs ordered are listed, but only abnormal results are displayed) Labs Reviewed  COMPREHENSIVE METABOLIC PANEL WITH GFR - Abnormal;  Notable for the following components:      Result Value   Potassium 3.2 (*)    Glucose, Bld 100 (*)    Calcium  8.6 (*)    All other components within normal limits  URINALYSIS, ROUTINE W REFLEX MICROSCOPIC - Abnormal; Notable for the following components:   Color, Urine YELLOW (*)    APPearance CLOUDY (*)    Hgb urine dipstick MODERATE (*)    Leukocytes,Ua SMALL (*)    Bacteria, UA RARE (*)    All other components within normal limits  LIPASE, BLOOD  CBC  POC URINE PREG, ED     I ordered and reviewed the above labs they are notable for cell counts electrolytes all unremarkable, LFTs unremarkable.  RADIOLOGY I independently reviewed and interpreted right upper quadrant ultrasound see no frank cholecystitis or large gallstones I also reviewed radiologist's formal read.   PROCEDURES:  Critical Care performed: No  Procedures   MEDICATIONS ORDERED IN ED: Medications  sodium chloride  0.9 % bolus 1,000 mL (1,000 mLs Intravenous Bolus from Bag 07/18/24  9651)  ondansetron  (ZOFRAN ) injection 4 mg (4 mg Intravenous Given 07/18/24 0348)     IMPRESSION / MDM / ASSESSMENT AND PLAN / ED COURSE  I reviewed the triage vital signs and the nursing notes.                                Patient's presentation is most consistent with acute presentation with potential threat to life or bodily function.  Differential diagnosis includes, but is not limited to, gastroenteritis, electrolyte derangements or dehydration, biliary colic, cholecystitis, pancreatitis, considered but less likely other acute abdominal pathologies like obstruction or appendicitis or ovarian torsion   The patient is on the cardiac monitor to evaluate for evidence of arrhythmia and/or significant heart rate changes.  MDM:    Symptoms most consistent with a viral gastroenteritis however given th focal tenderness to the right upper quadrant will check right upper quadrant ultrasound for evidence of biliary problems.   Otherwise cell counts show no leukocytosis, otherwise fairly benign abdominal exam in the other quadrants rules against other surgical abdominal pathologies like appendicitis at this time so defer advanced imaging with CT and avoid radiation- give IV fluids and Zofran .       FINAL CLINICAL IMPRESSION(S) / ED DIAGNOSES   Final diagnoses:  Abdominal cramping  Nausea and vomiting, unspecified vomiting type     Rx / DC Orders   ED Discharge Orders          Ordered    ondansetron  (ZOFRAN -ODT) 4 MG disintegrating tablet  Every 8 hours PRN        07/18/24 0430             Note:  This document was prepared using Dragon voice recognition software and may include unintentional dictation errors.    Cyrena Mylar, MD 07/18/24 [phone]

## 2024-07-18 NOTE — Discharge Instructions (Addendum)
 Fortunately your evaluation Emergency Department did not show any emergency conditions that require hospitalization or surgery at this time.  There is a very mild liver cyst identified on your ultrasound that is not contributing to your symptoms.  You can talk to your primary doctor about further testing of this as needed, but per radiology guidelines this is something that does not need further follow-up.  Most likely your symptoms are due to a stomach virus that should get better with time.  Take Zofran  as needed for nausea and vomiting.  Drink plenty of fluids to stay well-hydrated  Take acetaminophen  650 mg and ibuprofen  400 mg every 6 hours for pain.  Take with food.  Thank you for choosing us  for your health care today!  Please see your primary doctor this week for a follow up appointment.   If you have any new, worsening, or unexpected symptoms call your doctor right away or come back to the emergency department for reevaluation.  It was my pleasure to care for you today.   Ginnie EDISON Cyrena, MD

## 2024-07-19 ENCOUNTER — Ambulatory Visit
Admission: RE | Admit: 2024-07-19 | Discharge: 2024-07-19 | Disposition: A | Discharge status: Home / Self Care | Payer: Self-pay | Attending: Emergency Medicine | Admitting: Emergency Medicine

## 2024-07-19 VITALS — BP 133/84 | HR 67 | Temp 98.4°F | Resp 18 | Ht 61.0 in | Wt 250.0 lb

## 2024-07-19 DIAGNOSIS — Z5189 Encounter for other specified aftercare: Secondary | ICD-10-CM | POA: Insufficient documentation

## 2024-07-19 DIAGNOSIS — Z113 Encounter for screening for infections with a predominantly sexual mode of transmission: Secondary | ICD-10-CM | POA: Insufficient documentation

## 2024-07-19 DIAGNOSIS — R1030 Lower abdominal pain, unspecified: Secondary | ICD-10-CM | POA: Insufficient documentation

## 2024-07-19 DIAGNOSIS — Z3202 Encounter for pregnancy test, result negative: Secondary | ICD-10-CM | POA: Insufficient documentation

## 2024-07-19 LAB — POCT URINE DIPSTICK
Bilirubin, UA: NEGATIVE
Glucose, UA: NEGATIVE mg/dL
Ketones, POC UA: NEGATIVE mg/dL
Leukocytes, UA: NEGATIVE
Nitrite, UA: NEGATIVE
Protein Ur, POC: NEGATIVE mg/dL
Spec Grav, UA: 1.02 (ref 1.010–1.025)
Urobilinogen, UA: 0.2 U/dL
pH, UA: 7 (ref 5.0–8.0)

## 2024-07-19 LAB — POCT URINE PREGNANCY: Preg Test, Ur: NEGATIVE
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
## 2024-07-19 NOTE — Discharge Instructions (Addendum)
 Follow up with your primary care provider on Monday.  Go to the emergency department if you have worsening symptoms.    Your vaginal tests are pending.  If your test results are positive, we will call you.  You and your sexual partner(s) may require treatment at that time.  Do not have sexual activity for at least 7 days.    The wound in your scalp appears to have healed well.

## 2024-07-19 NOTE — ED Triage Notes (Signed)
 Patient c/o lower abdominal pain and low back pain for 4 days.  Patient was seen in the ED last night w/CT scan of abdominal area.  Patient was given Zofran  for nausea.  UPT was negative.

## 2024-07-21 LAB — CERVICOVAGINAL ANCILLARY ONLY
Bacterial Vaginitis (gardnerella): NEGATIVE
Candida Glabrata: NEGATIVE
Candida Vaginitis: NEGATIVE
Chlamydia: NEGATIVE
Comment: NEGATIVE
Comment: NEGATIVE
Comment: NEGATIVE
Comment: NEGATIVE
Comment: NEGATIVE
Comment: NORMAL
Neisseria Gonorrhea: NEGATIVE
Trichomonas: NEGATIVE

## 2024-08-21 ENCOUNTER — Ambulatory Visit
Admission: RE | Admit: 2024-08-21 | Discharge: 2024-08-21 | Disposition: A | Discharge status: Home / Self Care | Payer: Self-pay | Attending: Family Medicine | Admitting: Family Medicine

## 2024-08-21 VITALS — BP 113/81 | HR 78 | Temp 99.8°F | Resp 16

## 2024-08-21 DIAGNOSIS — N898 Other specified noninflammatory disorders of vagina: Secondary | ICD-10-CM | POA: Insufficient documentation

## 2024-08-21 DIAGNOSIS — R300 Dysuria: Secondary | ICD-10-CM | POA: Insufficient documentation

## 2024-08-21 DIAGNOSIS — Z202 Contact with and (suspected) exposure to infections with a predominantly sexual mode of transmission: Secondary | ICD-10-CM | POA: Insufficient documentation

## 2024-08-21 DIAGNOSIS — J069 Acute upper respiratory infection, unspecified: Secondary | ICD-10-CM | POA: Insufficient documentation

## 2024-08-21 LAB — URINALYSIS, W/ REFLEX TO CULTURE (INFECTION SUSPECTED)
Glucose, UA: NEGATIVE mg/dL
Ketones, ur: NEGATIVE mg/dL
Leukocytes,Ua: NEGATIVE
Nitrite: NEGATIVE
Protein, ur: 30 mg/dL — AB
Specific Gravity, Urine: >1.03 — ABNORMAL HIGH (ref 1.005–1.030)
WBC, UA: NONE SEEN WBC/hpf (ref 0–5)
pH: 5.5 (ref 5.0–8.0)

## 2024-08-21 LAB — SARS CORONAVIRUS 2 BY RT PCR: SARS Coronavirus 2 by RT PCR: NEGATIVE

## 2024-08-21 LAB — GROUP A STREP BY PCR: Group A Strep by PCR: NOT DETECTED

## 2024-08-21 MED ORDER — LIDOCAINE VISCOUS HCL 2 % MT SOLN: 15.0000 mL | OROMUCOSAL | 0 refills | Status: AC | PRN

## 2024-08-21 MED ORDER — LIDOCAINE VISCOUS HCL 2 % MT SOLN
15.0000 mL | Freq: Once | OROMUCOSAL | Status: AC
  Administered 2024-08-21: 15 mL via OROMUCOSAL

## 2024-08-21 NOTE — Discharge Instructions (Addendum)
 Your vaginal swab and urine study culture results will be available in the next 72 hours. If positive, someone will contact you.  You should see your results in your MyChart account.   Your strep and COVID are negative. You have a viral respiratory infection that will gradually improve over the next 7-10 days.  You can take Tylenol  and/or Ibuprofen  as needed for fever reduction and pain relief.    For sore throat: try warm salt water gargles, Mucinex sore throat cough drops or cepacol lozenges, throat spray, warm tea or water with lemon/honey, popsicles or ice, or OTC cold relief medicine for throat discomfort. You can also purchase chloraseptic spray at the pharmacy or dollar store. Pick up the prescription for throat pain relief gel.    For congestion: take a daily anti-histamine like Zyrtec, Claritin, and a oral decongestant, such as pseudoephedrine.  You can also use Flonase  1-2 sprays in each nostril daily. Afrin is also a good option, if you do not have high blood pressure.    It is important to stay hydrated: drink plenty of fluids (water, gatorade/powerade/pedialyte, juices, or teas) to keep your throat moisturized and help further relieve irritation/discomfort.    Return or go to the Emergency Department if symptoms worsen or do not improve in the next few days
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
## 2024-08-21 NOTE — ED Triage Notes (Addendum)
 Sore throat, body aches, chills x 3 days.  Taking mucinex.   Vaginal irritation, itching x 1 week. Urinary frequency, burning with urination x 1 week.  Pt would also like std testing.

## 2024-08-22 LAB — URINE CULTURE

## 2024-08-22 LAB — CERVICOVAGINAL ANCILLARY ONLY
Bacterial Vaginitis (gardnerella): NEGATIVE
Candida Glabrata: NEGATIVE
Candida Vaginitis: POSITIVE — AB
Chlamydia: NEGATIVE
Comment: NEGATIVE
Comment: NEGATIVE
Comment: NEGATIVE
Comment: NEGATIVE
Comment: NEGATIVE
Comment: NORMAL
Neisseria Gonorrhea: NEGATIVE
Trichomonas: NEGATIVE

## 2024-08-25 ENCOUNTER — Telehealth: Payer: Self-pay | Admitting: Family Medicine

## 2024-08-25 ENCOUNTER — Ambulatory Visit (HOSPITAL_COMMUNITY): Payer: Self-pay

## 2024-08-25 DIAGNOSIS — B3731 Acute candidiasis of vulva and vagina: Secondary | ICD-10-CM

## 2024-08-25 MED ORDER — FLUCONAZOLE 150 MG PO TABS: ORAL_TABLET | ORAL | 0 refills | Status: DC

## 2024-08-25 MED ORDER — FLUCONAZOLE 150 MG PO TABS: ORAL_TABLET | ORAL | 0 refills | Status: AC

## 2024-08-25 NOTE — Telephone Encounter (Signed)
 Vaginal Aptima swab is positive for candida. Prescribed Diflucan  for 2 doses. Patient called and updated with prescription sent to her preferred pharmacy by RN Tiara.   Caprice Porteous, DO
